# Patient Record
Sex: Male | Born: 1970
Health system: Southern US, Community
[De-identification: ages and names within clinical notes are randomized; demographics above are authoritative.]

## PROBLEM LIST (undated history)

## (undated) DIAGNOSIS — K219 Gastro-esophageal reflux disease without esophagitis: Secondary | ICD-10-CM

## (undated) DIAGNOSIS — F329 Major depressive disorder, single episode, unspecified: Secondary | ICD-10-CM

## (undated) DIAGNOSIS — K603 Anal fistula, unspecified: Secondary | ICD-10-CM

## (undated) DIAGNOSIS — F32A Depression, unspecified: Secondary | ICD-10-CM

## (undated) DIAGNOSIS — F419 Anxiety disorder, unspecified: Secondary | ICD-10-CM

## (undated) DIAGNOSIS — I10 Essential (primary) hypertension: Secondary | ICD-10-CM

## (undated) DIAGNOSIS — M1990 Unspecified osteoarthritis, unspecified site: Secondary | ICD-10-CM

## (undated) HISTORY — PX: COLONOSCOPY: SHX174

## (undated) HISTORY — PX: NASAL SINUS SURGERY: SHX719

## (undated) HISTORY — DX: Essential (primary) hypertension: I10

## (undated) HISTORY — DX: Anxiety disorder, unspecified: F41.9

## (undated) HISTORY — PX: UPPER GASTROINTESTINAL ENDOSCOPY: SHX188

## (undated) HISTORY — PX: HERNIA REPAIR: SHX51

## (undated) HISTORY — PX: INGUINAL HERNIA REPAIR: SUR1180

## (undated) HISTORY — DX: Depression, unspecified: F32.A

---
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
## 2005-08-10 ENCOUNTER — Emergency Department (HOSPITAL_COMMUNITY): Admission: EM | Admit: 2005-08-10 | Discharge: 2005-08-10 | Payer: Self-pay | Admitting: Emergency Medicine

## 2008-10-18 ENCOUNTER — Encounter: Admission: RE | Admit: 2008-10-18 | Discharge: 2008-10-18 | Payer: Self-pay | Admitting: Family Medicine

## 2013-03-19 HISTORY — PX: NASAL SINUS SURGERY: SHX719

## 2014-06-02 ENCOUNTER — Other Ambulatory Visit: Payer: Self-pay | Admitting: Neurosurgery

## 2014-07-14 ENCOUNTER — Encounter (HOSPITAL_COMMUNITY)
Admission: RE | Admit: 2014-07-14 | Discharge: 2014-07-14 | Disposition: A | Discharge status: Home / Self Care | Payer: BLUE CROSS/BLUE SHIELD | Source: Ambulatory Visit | Attending: Neurosurgery | Admitting: Neurosurgery

## 2014-07-14 ENCOUNTER — Encounter (HOSPITAL_COMMUNITY): Payer: Self-pay

## 2014-07-14 DIAGNOSIS — F17200 Nicotine dependence, unspecified, uncomplicated: Secondary | ICD-10-CM | POA: Insufficient documentation

## 2014-07-14 DIAGNOSIS — Z01812 Encounter for preprocedural laboratory examination: Secondary | ICD-10-CM | POA: Insufficient documentation

## 2014-07-14 DIAGNOSIS — K219 Gastro-esophageal reflux disease without esophagitis: Secondary | ICD-10-CM | POA: Diagnosis not present

## 2014-07-14 DIAGNOSIS — Z01818 Encounter for other preprocedural examination: Secondary | ICD-10-CM | POA: Insufficient documentation

## 2014-07-14 HISTORY — DX: Gastro-esophageal reflux disease without esophagitis: K21.9

## 2014-07-14 HISTORY — DX: Unspecified osteoarthritis, unspecified site: M19.90

## 2014-07-14 LAB — CBC WITH DIFFERENTIAL/PLATELET
Basophils Absolute: 0.1 10*3/uL (ref 0.0–0.1)
Basophils Relative: 1 % (ref 0–1)
EOS PCT: 2 % (ref 0–5)
Eosinophils Absolute: 0.2 10*3/uL (ref 0.0–0.7)
HEMATOCRIT: 33.7 % — AB (ref 39.0–52.0)
HEMOGLOBIN: 9.7 g/dL — AB (ref 13.0–17.0)
LYMPHS ABS: 2.3 10*3/uL (ref 0.7–4.0)
Lymphocytes Relative: 26 % (ref 12–46)
MCH: 20.4 pg — ABNORMAL LOW (ref 26.0–34.0)
MCHC: 28.8 g/dL — ABNORMAL LOW (ref 30.0–36.0)
MCV: 70.8 fL — ABNORMAL LOW (ref 78.0–100.0)
MONOS PCT: 11 % (ref 3–12)
Monocytes Absolute: 1 10*3/uL (ref 0.1–1.0)
NEUTROS PCT: 60 % (ref 43–77)
Neutro Abs: 5.4 10*3/uL (ref 1.7–7.7)
PLATELETS: 292 10*3/uL (ref 150–400)
RBC: 4.76 MIL/uL (ref 4.22–5.81)
RDW: 17.5 % — ABNORMAL HIGH (ref 11.5–15.5)
WBC: 9 10*3/uL (ref 4.0–10.5)

## 2014-07-14 LAB — PROTIME-INR
INR: 1.11 (ref 0.00–1.49)
Prothrombin Time: 14.5 seconds (ref 11.6–15.2)

## 2014-07-14 LAB — BASIC METABOLIC PANEL
ANION GAP: 8 (ref 5–15)
BUN: 17 mg/dL (ref 6–23)
CALCIUM: 9.8 mg/dL (ref 8.4–10.5)
CO2: 25 mmol/L (ref 19–32)
CREATININE: 0.83 mg/dL (ref 0.50–1.35)
Chloride: 106 mmol/L (ref 96–112)
Glucose, Bld: 104 mg/dL — ABNORMAL HIGH (ref 70–99)
Potassium: 3.6 mmol/L (ref 3.5–5.1)
Sodium: 139 mmol/L (ref 135–145)

## 2014-07-14 LAB — SURGICAL PCR SCREEN
MRSA, PCR: NEGATIVE
STAPHYLOCOCCUS AUREUS: POSITIVE — AB

## 2014-07-14 NOTE — Progress Notes (Addendum)
Denies any cardiac issues.   DA  Called Lorriane Shire regarding patients hgb of 9.7   DA

## 2014-07-14 NOTE — Pre-Procedure Instructions (Addendum)
Ranveer Wahlstrom  07/14/2014   Your procedure is scheduled on: Tuesday, May 10th   Report to [facility] Admitting at 6:00 AM.   Call this number if you have problems the morning of surgery: [phone]   Remember:   Do not eat food or drink liquids after midnight Monday.    Take these medicines the morning of surgery with A SIP OF WATER: Hydrocodone.  Please use Flonase the morning of surgery.                           Stop taking any aspirin, anti-inflammatories 4-5 days prior to surgery.   Do not wear jewelry - no rings or watches. Do not wear lotions or colognes.   . Men may shave face and neck.   Do not bring valuables to the hospital.  [facility] is not responsible for any belongings or valuables.               Contacts, dentures or bridgework may not be worn into surgery.  Leave suitcase in the car. After surgery it may be brought to your room.  For patients admitted to the hospital, discharge time is determined by your treatment team.                 Name and phone number of your driver:                Special Instructions: "Preparing for Surgery" instructin sheet.   Please read over the following fact sheets that you were given: Pain Booklet, Coughing and Deep Breathing, MRSA Information and Surgical Site Infection Prevention

## 2014-07-15 ENCOUNTER — Encounter (HOSPITAL_COMMUNITY): Payer: Self-pay | Admitting: Emergency Medicine

## 2014-07-15 NOTE — Progress Notes (Signed)
Anesthesia Chart Review:  Pt is 44 year old male scheduled for maximum access PLIF on 07/27/2014 with Dr. Annette Stable.   PCP is Carlos Levering, PA at [facility].   PMH includes: GERD. Current smoker. BMI 26.   Preoperative labs reviewed.  H/H 9.7/33.7. PAT RN notified Lorriane Shire in Dr. Marchelle Folks office of low results.   PCP's office does not have CBC results for pt for comparison.   Discussed with Dr. Lissa Hoard. Pt will need medical clearance prior to surgery. Notified Vanessa in Dr. Marchelle Folks office.   Willeen Cass, FNP-BC [facility]/Anesthesiology Phone: [phone] 07/15/2014 4:08 PM

## 2014-07-21 NOTE — Progress Notes (Signed)
Spoke with Lorriane Shire about Medical Clearance. She states that they have not received anything from pt.'s medical doctor as of today. She will follow-up with patient.

## 2014-07-27 ENCOUNTER — Encounter (HOSPITAL_COMMUNITY): Admission: RE | Payer: BLUE CROSS/BLUE SHIELD | Source: Ambulatory Visit

## 2014-07-27 ENCOUNTER — Inpatient Hospital Stay (HOSPITAL_COMMUNITY): Admission: RE | Admit: 2014-07-27 | Payer: BLUE CROSS/BLUE SHIELD | Source: Ambulatory Visit | Admitting: Neurosurgery

## 2014-07-27 SURGERY — FOR MAXIMUM ACCESS (MAS) POSTERIOR LUMBAR INTERBODY FUSION (PLIF) 1 LEVEL
Anesthesia: General

## 2014-08-05 ENCOUNTER — Telehealth: Payer: Self-pay | Admitting: Hematology & Oncology

## 2014-08-05 NOTE — Telephone Encounter (Signed)
Per Referral to sch New patient apt.  Apt was sch for 08/30/14.  i called patient and gave apt date/time, location, and instructions to bring medication bottles and insurance card.  Patient is aware of apt

## 2014-08-27 ENCOUNTER — Other Ambulatory Visit: Payer: Self-pay | Admitting: Emergency Medicine

## 2014-08-27 ENCOUNTER — Telehealth: Payer: Self-pay | Admitting: Hematology & Oncology

## 2014-08-27 NOTE — Telephone Encounter (Signed)
Left vm w NEW PATIENT today to remind them of their appointment with Dr. Ennever. Also, advised them to bring all medication bottles and insurance card information. ° °

## 2014-08-30 ENCOUNTER — Ambulatory Visit (HOSPITAL_BASED_OUTPATIENT_CLINIC_OR_DEPARTMENT_OTHER): Payer: BLUE CROSS/BLUE SHIELD | Admitting: Hematology & Oncology

## 2014-08-30 ENCOUNTER — Ambulatory Visit: Payer: BLUE CROSS/BLUE SHIELD

## 2014-08-30 ENCOUNTER — Other Ambulatory Visit (HOSPITAL_BASED_OUTPATIENT_CLINIC_OR_DEPARTMENT_OTHER): Payer: BLUE CROSS/BLUE SHIELD

## 2014-08-30 ENCOUNTER — Encounter: Payer: Self-pay | Admitting: Hematology & Oncology

## 2014-08-30 VITALS — BP 148/83 | HR 78 | Temp 97.7°F | Resp 16 | Wt 221.1 lb

## 2014-08-30 DIAGNOSIS — D589 Hereditary hemolytic anemia, unspecified: Secondary | ICD-10-CM

## 2014-08-30 DIAGNOSIS — D500 Iron deficiency anemia secondary to blood loss (chronic): Secondary | ICD-10-CM

## 2014-08-30 DIAGNOSIS — D509 Iron deficiency anemia, unspecified: Secondary | ICD-10-CM

## 2014-08-30 LAB — CBC WITH DIFFERENTIAL (CANCER CENTER ONLY)
BASO#: 0.1 10*3/uL (ref 0.0–0.2)
BASO%: 1.4 % (ref 0.0–2.0)
EOS ABS: 0.3 10*3/uL (ref 0.0–0.5)
EOS%: 3.8 % (ref 0.0–7.0)
HCT: 34.8 % — ABNORMAL LOW (ref 38.7–49.9)
HEMOGLOBIN: 10.5 g/dL — AB (ref 13.0–17.1)
LYMPH#: 1.9 10*3/uL (ref 0.9–3.3)
LYMPH%: 23.7 % (ref 14.0–48.0)
MCH: 22.7 pg — ABNORMAL LOW (ref 28.0–33.4)
MCHC: 30.2 g/dL — ABNORMAL LOW (ref 32.0–35.9)
MCV: 75 fL — ABNORMAL LOW (ref 82–98)
MONO#: 0.8 10*3/uL (ref 0.1–0.9)
MONO%: 10.6 % (ref 0.0–13.0)
NEUT%: 60.5 % (ref 40.0–80.0)
NEUTROS ABS: 4.8 10*3/uL (ref 1.5–6.5)
Platelets: 294 10*3/uL (ref 145–400)
RBC: 4.63 10*6/uL (ref 4.20–5.70)
RDW: 21 % — ABNORMAL HIGH (ref 11.1–15.7)
WBC: 7.9 10*3/uL (ref 4.0–10.0)

## 2014-08-30 LAB — TECHNOLOGIST REVIEW CHCC SATELLITE

## 2014-08-30 LAB — CHCC SATELLITE - SMEAR

## 2014-08-30 NOTE — Progress Notes (Signed)
Referral MD  Reason for Referral: Microcytic anemia   No chief complaint on file. : I was supposed to have back surgery but this was canceled because of my blood counts.  HPI: Mr. Carlos Daugherty is a 44 year old gentleman. He's been in good shape. He unfortunately has a disc herniation at L4-5. He was to undergo a laminectomy. However, he had some preop labs done which showed him to be quite anemic. The laboratory that I have on him showed that his was a cows 9. Hemoglobin 9.7. Hematocrit 33.7. His platelet count was 292,000. His MCV was quite low at 71.  Because of this, his surgery was canceled and he was referred to the [facility] for an evaluation.  He has had a colonoscopy in the past. He was told he has bleeding hemorrhoids. The hemorrhoids still bleeding but not as bad.  Of note, he had nasal sinus surgery earlier this year. There is no mention of his blood counts being a problem.  He has been taking some iron supplements.  He and his family went on a cruise back in May. He felt good. Had a little bit of ankle swelling. He did not note any bleeding.  He has smoked in the past but now has stopped. He does have some alcohol use. He has no obvious risk factors for hepatitis or HIV.  There is no cancer in the family.  He does not chew ice. He's had no pain in his hands or feet. He does have the sciatic-like pain down the left leg.  Overall, his performance status is ECOG 0.   Past Medical History  Diagnosis Date  . GERD (gastroesophageal reflux disease)   . Arthritis   :  Past Surgical History  Procedure Laterality Date  . Nasal sinus surgery    . Hernia repair    . Upper gastrointestinal endoscopy      looking d/t acid reflux  :   Current outpatient prescriptions:  .  esomeprazole (NEXIUM) 20 MG capsule, Take 22.5 mg by mouth daily at 12 noon., Disp: , Rfl:  .  fluticasone (FLONASE) 50 MCG/ACT nasal spray, Place 2 sprays into both nostrils at bedtime. ,  Disp: , Rfl: 11 .  HYDROcodone-acetaminophen (NORCO) 10-325 MG per tablet, Take 1 tablet by mouth every 6 (six) hours as needed (pain). , Disp: , Rfl: 0 .  ibuprofen (ADVIL,MOTRIN) 200 MG tablet, Take 600 mg by mouth 2 (two) times daily as needed (pain)., Disp: , Rfl:  .  Tetrahydrozoline HCl (VISINE OP), Place 1 drop into both eyes daily as needed (dry eyes)., Disp: , Rfl: :  :  No Known Allergies:  History reviewed. No pertinent family history.:  History   Social History  . Marital Status: Married    Spouse Name: N/A  . Number of Children: N/A  . Years of Education: N/A   Occupational History  . Not on file.   Social History Main Topics  . Smoking status: Current Every Day Smoker -- 1 years    Types: E-cigarettes  . Smokeless tobacco: Not on file  . Alcohol Use: 3.6 oz/week    6 Cans of beer per week  . Drug Use: No  . Sexual Activity: Not on file   Other Topics Concern  . Not on file   Social History Narrative  :  Pertinent items are noted in HPI.  Exam: _0 @  well-developed and well-nourished white gentleman in no obvious distress. Vital signs are temperature of 97.7. Pulse  78. Blood pressure 148/83. Weight is 221 pounds. Head and neck exam shows no ocular or oral lesions. There is no scleral icterus. He has no adenopathy in the neck. There is no glossitis. Thyroid is nonpalpable. Lungs are clear to percussion and auscultation bilaterally cardiac exam regular rate and rhythm with no murmurs, rubs or bruits. Abdomen is soft. He has good bowel sounds. There is no fluid wave. There is no palpable liver or spleen tip. Back exam shows no tenderness over the spine, ribs or hips. Extremities shows no clubbing, cyanosis or edema. He has good range of motion of his joints. This no joint swelling, erythema or warmth. Skin exam shows no rashes, ecchymoses or petechia. Neurological exam shows no focal neurological deficits.    Recent Labs  08/30/14 1503  WBC 7.9  HGB  10.5*  HCT 34.8*  PLT 294   No results for input(s): NA, K, CL, CO2, GLUCOSE, BUN, CREATININE, CALCIUM in the last 72 hours.  Blood smear review: Microcytic population of red blood cells. There is some slight anisocytosis. I see no target cells. I really do not see spherocytes. He has no nucleated red blood cells. I see no teardrop cells. He has no rouleau formation. White cells per normal morphology maturation. There is no immature myeloid or lymphoid forms. He has no hypersegmented polys. There are no blasts. Platelets are adequate in number and size. He has well granulated platelets.  Pathology: None     Assessment and Plan: Mr. Carlos Daugherty is a 44 year old white male. He has microcytic anemia. I have to believe that this is iron deficiency. The blood smear is very consistent with iron deficiency.  I do not see anything that looks like hemolytic anemia. I do not see any spherocytes that look like hereditary spherocytosis. He has no changes on his blood smear that would suggest a marrow issue.  I do not see that we have to do a bone marrow biopsy on him.  I gave him a prescription for therapeutic iron. I think this will get his blood count up.  I really do not see any reason that he cannot have his back surgery. I cannot imagine him having complications from the surgery because of his blood counts. I will let his neurosurgeon know that I saw him.  As far as getting him back to our office, hopefully, we can take care of a lot of this over the phone.  I spent a good 45 minutes with he and his wife. The both their very nice. I just feel bad for him that he has all the anal issues and that surgery will be able to take care of this.

## 2014-08-31 LAB — IRON AND TIBC CHCC
Iron: <11 ug/dL — ABNORMAL LOW (ref 42–163)
TIBC: 523 ug/dL — ABNORMAL HIGH (ref 202–409)

## 2014-08-31 LAB — FERRITIN CHCC: FERRITIN: 11 ng/mL — AB (ref 22–316)

## 2014-09-01 ENCOUNTER — Telehealth: Payer: Self-pay | Admitting: *Deleted

## 2014-09-01 LAB — RETICULOCYTES (CHCC)
ABS Retic: 61.1 10*3/uL (ref 19.0–186.0)
RBC.: 4.7 MIL/uL (ref 4.22–5.81)
RETIC CT PCT: 1.3 % (ref 0.4–2.3)

## 2014-09-01 LAB — HEMOGLOBINOPATHY EVALUATION
HEMOGLOBIN OTHER: 0 %
HGB A2 QUANT: 2 % — AB (ref 2.2–3.2)
HGB A: 98 % — AB (ref 96.8–97.8)
HGB F QUANT: 0 % (ref 0.0–2.0)
HGB S QUANTITAION: 0 %

## 2014-09-01 LAB — COMPREHENSIVE METABOLIC PANEL
ALBUMIN: 4.8 g/dL (ref 3.5–5.2)
ALK PHOS: 79 U/L (ref 39–117)
ALT: 42 U/L (ref 0–53)
AST: 23 U/L (ref 0–37)
BUN: 13 mg/dL (ref 6–23)
CALCIUM: 10 mg/dL (ref 8.4–10.5)
CHLORIDE: 104 meq/L (ref 96–112)
CO2: 25 mEq/L (ref 19–32)
Creatinine, Ser: 0.89 mg/dL (ref 0.50–1.35)
Glucose, Bld: 112 mg/dL — ABNORMAL HIGH (ref 70–99)
POTASSIUM: 5.1 meq/L (ref 3.5–5.3)
SODIUM: 139 meq/L (ref 135–145)
Total Bilirubin: 0.3 mg/dL (ref 0.2–1.2)
Total Protein: 7.9 g/dL (ref 6.0–8.3)

## 2014-09-01 LAB — HAPTOGLOBIN: Haptoglobin: 148 mg/dL (ref 43–212)

## 2014-09-01 LAB — LACTATE DEHYDROGENASE: LDH: 147 U/L (ref 94–250)

## 2014-09-01 NOTE — Telephone Encounter (Signed)
-----   Message from [name], MD sent at 09/01/2014  1:19 PM EDT ----- I left a message on his machine about his iron studies being low. I told him that the anemia is probably from iron deficiency. I would think that this is from his bleeding hemorrhoids. I don't think we have to do any scans or another colonoscopy on him at this point.  I told him that I spoke to his neurosurgeon, Dr. Trenton Gammon, and his office will set him up for surgery, hopefully quickly.  I want to see him back in 3 months. Our office will call him regarding this. I told him that if he has any questions, that he can was call us.  Carlos Daugherty

## 2014-09-03 ENCOUNTER — Other Ambulatory Visit: Payer: Self-pay | Admitting: Neurosurgery

## 2014-09-03 LAB — RBC OSMOTIC FRAGILITY
NACL  0.30%: 75 % — ABNORMAL LOW (ref 80–100)
NACL  0.45%: 25 % — ABNORMAL LOW (ref 54–96)
NACL  0.65%: 2 % (ref 0–19)
NACL 0.35%: 58 % — AB (ref 72–100)
NACL 0.40%: 41 % — AB (ref 65–100)
NACL 0.50%: 16 % — AB (ref 36–88)
NACL 0.55%: 9 % (ref 5–70)
NACL 0.60%: 5 % (ref 0–40)
NACL 0.85%: 0 % (ref 0–0)

## 2014-09-22 NOTE — Pre-Procedure Instructions (Signed)
Carlos Daugherty  09/22/2014     Your procedure is scheduled on: Monday September 27, 2014 at 2:00 PM.  Report to [facility] Admitting at 12:00 P.M.  Call this number if you have problems the morning of surgery: [phone]    Remember:  Do not eat food or drink liquids after midnight.  Take these medicines the morning of surgery with A SIP OF WATER: Esomeprazole (Nexium), Hydrocodone if needed, Eye drops if needed   Please stop taking any vitamins, herbal medications, Ibuprofen, Advil, Motrin, Aleve, etc   Do not wear jewelry.  Do not wear lotions, powders, or cologne.     Men may shave face and neck.  Do not bring valuables to the hospital.  [facility] is not responsible for any belongings or valuables.  Contacts, dentures or bridgework may not be worn into surgery.  Leave your suitcase in the car.  After surgery it may be brought to your room.  For patients admitted to the hospital, discharge time will be determined by your treatment team.  Patients discharged the day of surgery will not be allowed to drive home.   Name and phone number of your driver:    Special instructions:  Shower using CHG soap the night before and the morning of your surgery  Please read over the following fact sheets that you were given. Pain Booklet, Coughing and Deep Breathing, Blood Transfusion Information, MRSA Information and Surgical Site Infection Prevention

## 2014-09-23 ENCOUNTER — Encounter (HOSPITAL_COMMUNITY): Payer: Self-pay

## 2014-09-23 ENCOUNTER — Encounter (HOSPITAL_COMMUNITY)
Admission: RE | Admit: 2014-09-23 | Discharge: 2014-09-23 | Disposition: A | Discharge status: Home / Self Care | Payer: BLUE CROSS/BLUE SHIELD | Source: Ambulatory Visit | Attending: Neurosurgery | Admitting: Neurosurgery

## 2014-09-23 DIAGNOSIS — Z0183 Encounter for blood typing: Secondary | ICD-10-CM | POA: Insufficient documentation

## 2014-09-23 DIAGNOSIS — Z01812 Encounter for preprocedural laboratory examination: Secondary | ICD-10-CM | POA: Diagnosis not present

## 2014-09-23 DIAGNOSIS — Z01818 Encounter for other preprocedural examination: Secondary | ICD-10-CM | POA: Insufficient documentation

## 2014-09-23 DIAGNOSIS — M5126 Other intervertebral disc displacement, lumbar region: Secondary | ICD-10-CM | POA: Insufficient documentation

## 2014-09-23 DIAGNOSIS — M5136 Other intervertebral disc degeneration, lumbar region: Secondary | ICD-10-CM | POA: Insufficient documentation

## 2014-09-23 LAB — SURGICAL PCR SCREEN
MRSA, PCR: NEGATIVE
Staphylococcus aureus: NEGATIVE

## 2014-09-23 LAB — TYPE AND SCREEN
ABO/RH(D): A POS
ANTIBODY SCREEN: NEGATIVE

## 2014-09-23 LAB — CBC WITH DIFFERENTIAL/PLATELET
BASOS ABS: 0.1 10*3/uL (ref 0.0–0.1)
BASOS PCT: 1 % (ref 0–1)
EOS PCT: 2 % (ref 0–5)
Eosinophils Absolute: 0.2 10*3/uL (ref 0.0–0.7)
HCT: 41.6 % (ref 39.0–52.0)
HEMOGLOBIN: 13 g/dL (ref 13.0–17.0)
LYMPHS PCT: 17 % (ref 12–46)
Lymphs Abs: 1.9 10*3/uL (ref 0.7–4.0)
MCH: 25.6 pg — AB (ref 26.0–34.0)
MCHC: 31.3 g/dL (ref 30.0–36.0)
MCV: 81.9 fL (ref 78.0–100.0)
MONO ABS: 0.7 10*3/uL (ref 0.1–1.0)
MONOS PCT: 6 % (ref 3–12)
NEUTROS PCT: 74 % (ref 43–77)
Neutro Abs: 8.1 10*3/uL — ABNORMAL HIGH (ref 1.7–7.7)
Platelets: 298 10*3/uL (ref 150–400)
RBC: 5.08 MIL/uL (ref 4.22–5.81)
RDW: 26.1 % — ABNORMAL HIGH (ref 11.5–15.5)
WBC: 11 10*3/uL — AB (ref 4.0–10.5)

## 2014-09-23 LAB — ABO/RH: ABO/RH(D): A POS

## 2014-09-23 NOTE — Progress Notes (Signed)
Nurse called patient and informed him that it was OK for him to continue taking his fusion plus supplement and if anything changed he would be notified. Patient verbalized understanding.

## 2014-09-23 NOTE — Progress Notes (Signed)
PCP is Carlos Levering, PA-C. Patient informed Nurse that his blood count had been decreased, his surgery was canceled, and he had to see Dr. Burney Gauze about it.  Patient asked Nurse if he needed to stop taking the fusion plus probiotic he had been prescribed, and Nurse told patient that he did not need to stop taking the medication, however Ebony Hail, [place] would be notified of this, and Nurse would call patient later today and definitely let him know if he needed to stop taking fusion plus before surgery. Ebony Hail, [place] notified and she stated that patient did not need to stop taking medication, but he did not need to take it the morning of surgery, and if anything changed then patient would be notified.

## 2014-09-24 NOTE — Progress Notes (Signed)
Anesthesia follow-up: See anesthesia note by Kabbe, FNP-BC from 07/15/14. PLIF was postponed due to need for clearance for anemia with H/H 9.7/33.7 with MVC of 71.  Patient was referred to hematologist Dr. Marin Olp.  According to his note from 08/30/14, patient's anemia was felt due to iron deficiency--"The blood smear is very consistent with iron deficiency. I do not see anything that looks like hemolytic anemia. I do not see any spherocytes that look like hereditary spherocytosis. He has no changes on his blood smear that would suggest a marrow issue. I do not see that we have to do a bone marrow biopsy on him. I gave him a prescription for therapeutic iron. I think this will get his blood count up. I really do not see any reason that he cannot have his back surgery. I cannot imagine him having complications from the surgery because of his blood counts. I will let his neurosurgeon know that I saw him."  Patient has been on Fusion Plus. H/H is now up to 13.0/41.6, MCV normal at 81.9.   I anticipate that he can proceed as planned.  [facility] Stay [facility]/Anesthesiology Phone [phone] 09/24/2014 2:35 PM

## 2014-09-27 ENCOUNTER — Inpatient Hospital Stay (HOSPITAL_COMMUNITY): Payer: BLUE CROSS/BLUE SHIELD

## 2014-09-27 ENCOUNTER — Encounter (HOSPITAL_COMMUNITY)
Admission: RE | Disposition: A | Discharge status: Home / Self Care | Payer: Self-pay | Source: Ambulatory Visit | Attending: Neurosurgery

## 2014-09-27 ENCOUNTER — Inpatient Hospital Stay (HOSPITAL_COMMUNITY): Payer: BLUE CROSS/BLUE SHIELD | Admitting: Vascular Surgery

## 2014-09-27 ENCOUNTER — Inpatient Hospital Stay (HOSPITAL_COMMUNITY): Payer: BLUE CROSS/BLUE SHIELD | Admitting: Anesthesiology

## 2014-09-27 ENCOUNTER — Inpatient Hospital Stay (HOSPITAL_COMMUNITY)
Admission: RE | Admit: 2014-09-27 | Discharge: 2014-09-28 | DRG: 460 | Disposition: A | Discharge status: Home / Self Care | Payer: BLUE CROSS/BLUE SHIELD | Source: Ambulatory Visit | Attending: Neurosurgery | Admitting: Neurosurgery

## 2014-09-27 DIAGNOSIS — F17290 Nicotine dependence, other tobacco product, uncomplicated: Secondary | ICD-10-CM | POA: Diagnosis present

## 2014-09-27 DIAGNOSIS — M5136 Other intervertebral disc degeneration, lumbar region: Principal | ICD-10-CM | POA: Diagnosis present

## 2014-09-27 DIAGNOSIS — M1990 Unspecified osteoarthritis, unspecified site: Secondary | ICD-10-CM | POA: Diagnosis present

## 2014-09-27 DIAGNOSIS — K219 Gastro-esophageal reflux disease without esophagitis: Secondary | ICD-10-CM | POA: Diagnosis present

## 2014-09-27 DIAGNOSIS — Z419 Encounter for procedure for purposes other than remedying health state, unspecified: Secondary | ICD-10-CM

## 2014-09-27 HISTORY — PX: MAXIMUM ACCESS (MAS)POSTERIOR LUMBAR INTERBODY FUSION (PLIF) 1 LEVEL: SHX6368

## 2014-09-27 SURGERY — FOR MAXIMUM ACCESS (MAS) POSTERIOR LUMBAR INTERBODY FUSION (PLIF) 1 LEVEL
Anesthesia: General | Site: Back

## 2014-09-27 MED ORDER — OXYCODONE-ACETAMINOPHEN 5-325 MG PO TABS
1.0000 | ORAL_TABLET | ORAL | Status: DC | PRN
  Administered 2014-09-27 – 2014-09-28 (×4): 2 via ORAL
  Filled 2014-09-27 (×4): qty 2

## 2014-09-27 MED ORDER — FENTANYL CITRATE (PF) 250 MCG/5ML IJ SOLN
INTRAMUSCULAR | Status: AC
  Filled 2014-09-27: qty 5

## 2014-09-27 MED ORDER — ONDANSETRON HCL 4 MG/2ML IJ SOLN
INTRAMUSCULAR | Status: AC
  Filled 2014-09-27: qty 2

## 2014-09-27 MED ORDER — ONDANSETRON HCL 4 MG/2ML IJ SOLN
INTRAMUSCULAR | Status: DC | PRN
  Administered 2014-09-27: 4 mg via INTRAVENOUS

## 2014-09-27 MED ORDER — PHENOL 1.4 % MT LIQD: 1.0000 | OROMUCOSAL | Status: DC | PRN

## 2014-09-27 MED ORDER — KETAMINE HCL 100 MG/ML IJ SOLN
INTRAMUSCULAR | Status: AC
  Filled 2014-09-27: qty 1

## 2014-09-27 MED ORDER — MIDAZOLAM HCL 2 MG/2ML IJ SOLN
INTRAMUSCULAR | Status: AC
  Filled 2014-09-27: qty 2

## 2014-09-27 MED ORDER — CEFAZOLIN SODIUM 1-5 GM-% IV SOLN
1.0000 g | Freq: Three times a day (TID) | INTRAVENOUS | Status: AC
  Administered 2014-09-27 – 2014-09-28 (×2): 1 g via INTRAVENOUS
  Filled 2014-09-27 (×2): qty 50

## 2014-09-27 MED ORDER — HYDROMORPHONE HCL 1 MG/ML IJ SOLN
0.5000 mg | INTRAMUSCULAR | Status: DC | PRN
  Administered 2014-09-27: 1 mg via INTRAVENOUS
  Filled 2014-09-27: qty 1

## 2014-09-27 MED ORDER — DIAZEPAM 5 MG PO TABS
ORAL_TABLET | ORAL | Status: AC
  Filled 2014-09-27: qty 1

## 2014-09-27 MED ORDER — LIDOCAINE HCL (CARDIAC) 20 MG/ML IV SOLN
INTRAVENOUS | Status: DC | PRN
  Administered 2014-09-27: 60 mg via INTRAVENOUS

## 2014-09-27 MED ORDER — SUCCINYLCHOLINE CHLORIDE 20 MG/ML IJ SOLN
INTRAMUSCULAR | Status: DC | PRN
  Administered 2014-09-27: 120 mg via INTRAVENOUS

## 2014-09-27 MED ORDER — PROPOFOL 10 MG/ML IV BOLUS
INTRAVENOUS | Status: AC
  Filled 2014-09-27: qty 20

## 2014-09-27 MED ORDER — THROMBIN 20000 UNITS EX SOLR
CUTANEOUS | Status: DC | PRN
  Administered 2014-09-27: 20 mL via TOPICAL

## 2014-09-27 MED ORDER — PROPOFOL 10 MG/ML IV BOLUS
INTRAVENOUS | Status: DC | PRN
  Administered 2014-09-27: 200 mg via INTRAVENOUS

## 2014-09-27 MED ORDER — LIDOCAINE HCL (CARDIAC) 20 MG/ML IV SOLN
INTRAVENOUS | Status: AC
  Filled 2014-09-27: qty 5

## 2014-09-27 MED ORDER — VANCOMYCIN HCL 1000 MG IV SOLR
INTRAVENOUS | Status: AC
Start: 2014-09-27 — End: 2014-09-28
  Filled 2014-09-27: qty 1000

## 2014-09-27 MED ORDER — HYDROMORPHONE HCL 1 MG/ML IJ SOLN
INTRAMUSCULAR | Status: AC
  Filled 2014-09-27: qty 1

## 2014-09-27 MED ORDER — OXYCODONE HCL 5 MG PO TABS
ORAL_TABLET | ORAL | Status: AC
  Filled 2014-09-27: qty 1

## 2014-09-27 MED ORDER — DEXAMETHASONE SODIUM PHOSPHATE 10 MG/ML IJ SOLN
INTRAMUSCULAR | Status: AC
  Administered 2014-09-27: 10 mg via INTRAVENOUS
  Filled 2014-09-27: qty 1

## 2014-09-27 MED ORDER — DEXAMETHASONE SODIUM PHOSPHATE 10 MG/ML IJ SOLN: 10.0000 mg | Freq: Once | INTRAMUSCULAR | Status: DC

## 2014-09-27 MED ORDER — SODIUM CHLORIDE 0.9 % IJ SOLN: 3.0000 mL | INTRAMUSCULAR | Status: DC | PRN

## 2014-09-27 MED ORDER — ONDANSETRON HCL 4 MG/2ML IJ SOLN: 4.0000 mg | INTRAMUSCULAR | Status: DC | PRN

## 2014-09-27 MED ORDER — ROCURONIUM BROMIDE 50 MG/5ML IV SOLN
INTRAVENOUS | Status: AC
  Filled 2014-09-27: qty 1

## 2014-09-27 MED ORDER — FENTANYL CITRATE (PF) 100 MCG/2ML IJ SOLN
INTRAMUSCULAR | Status: DC | PRN
  Administered 2014-09-27: 100 ug via INTRAVENOUS
  Administered 2014-09-27: 150 ug via INTRAVENOUS
  Administered 2014-09-27: 50 ug via INTRAVENOUS
  Administered 2014-09-27: 150 ug via INTRAVENOUS
  Administered 2014-09-27: 50 ug via INTRAVENOUS
  Administered 2014-09-27: 100 ug via INTRAVENOUS
  Administered 2014-09-27 (×5): 50 ug via INTRAVENOUS
  Administered 2014-09-27: 100 ug via INTRAVENOUS

## 2014-09-27 MED ORDER — LACTATED RINGERS IV SOLN
INTRAVENOUS | Status: DC | PRN
  Administered 2014-09-27 (×2): via INTRAVENOUS

## 2014-09-27 MED ORDER — SODIUM CHLORIDE 0.9 % IV SOLN: 250.0000 mL | INTRAVENOUS | Status: DC

## 2014-09-27 MED ORDER — OXYCODONE HCL 5 MG PO TABS
5.0000 mg | ORAL_TABLET | Freq: Once | ORAL | Status: AC | PRN
  Administered 2014-09-27: 5 mg via ORAL

## 2014-09-27 MED ORDER — OXYCODONE HCL 5 MG/5ML PO SOLN: 5.0000 mg | Freq: Once | ORAL | Status: AC | PRN

## 2014-09-27 MED ORDER — KETOROLAC TROMETHAMINE 30 MG/ML IJ SOLN
30.0000 mg | Freq: Once | INTRAMUSCULAR | Status: AC | PRN
  Administered 2014-09-27: 30 mg via INTRAVENOUS

## 2014-09-27 MED ORDER — PANTOPRAZOLE SODIUM 40 MG PO TBEC
40.0000 mg | DELAYED_RELEASE_TABLET | Freq: Every day | ORAL | Status: DC
  Administered 2014-09-27: 40 mg via ORAL
  Filled 2014-09-27: qty 1

## 2014-09-27 MED ORDER — PROMETHAZINE HCL 25 MG/ML IJ SOLN: 6.2500 mg | INTRAMUSCULAR | Status: DC | PRN

## 2014-09-27 MED ORDER — ACETAMINOPHEN 325 MG PO TABS: 650.0000 mg | ORAL_TABLET | ORAL | Status: DC | PRN

## 2014-09-27 MED ORDER — CEFAZOLIN SODIUM-DEXTROSE 2-3 GM-% IV SOLR
INTRAVENOUS | Status: AC
  Filled 2014-09-27: qty 50

## 2014-09-27 MED ORDER — FLUTICASONE PROPIONATE 50 MCG/ACT NA SUSP
2.0000 | Freq: Every evening | NASAL | Status: DC | PRN
  Filled 2014-09-27: qty 16

## 2014-09-27 MED ORDER — MIDAZOLAM HCL 5 MG/5ML IJ SOLN
INTRAMUSCULAR | Status: DC | PRN
  Administered 2014-09-27: 2 mg via INTRAVENOUS

## 2014-09-27 MED ORDER — SODIUM CHLORIDE 0.9 % IJ SOLN: 3.0000 mL | Freq: Two times a day (BID) | INTRAMUSCULAR | Status: DC

## 2014-09-27 MED ORDER — MENTHOL 3 MG MT LOZG: 1.0000 | LOZENGE | OROMUCOSAL | Status: DC | PRN

## 2014-09-27 MED ORDER — CEFAZOLIN SODIUM-DEXTROSE 2-3 GM-% IV SOLR
2.0000 g | INTRAVENOUS | Status: AC
  Administered 2014-09-27: 2 g via INTRAVENOUS

## 2014-09-27 MED ORDER — LACTATED RINGERS IV SOLN
INTRAVENOUS | Status: DC
  Administered 2014-09-27: 13:00:00 via INTRAVENOUS

## 2014-09-27 MED ORDER — SODIUM CHLORIDE 0.9 % IR SOLN
Status: DC | PRN
  Administered 2014-09-27: 500 mL

## 2014-09-27 MED ORDER — HYDROMORPHONE HCL 1 MG/ML IJ SOLN
0.2500 mg | INTRAMUSCULAR | Status: AC | PRN
  Administered 2014-09-27 (×8): 0.5 mg via INTRAVENOUS

## 2014-09-27 MED ORDER — BUPIVACAINE HCL (PF) 0.25 % IJ SOLN
INTRAMUSCULAR | Status: DC | PRN
  Administered 2014-09-27: 20 mL

## 2014-09-27 MED ORDER — HYDROCODONE-ACETAMINOPHEN 5-325 MG PO TABS: 1.0000 | ORAL_TABLET | ORAL | Status: DC | PRN

## 2014-09-27 MED ORDER — 0.9 % SODIUM CHLORIDE (POUR BTL) OPTIME
TOPICAL | Status: DC | PRN
  Administered 2014-09-27: 1000 mL

## 2014-09-27 MED ORDER — KETOROLAC TROMETHAMINE 30 MG/ML IJ SOLN
INTRAMUSCULAR | Status: AC
  Filled 2014-09-27: qty 1

## 2014-09-27 MED ORDER — VANCOMYCIN HCL 1000 MG IV SOLR
INTRAVENOUS | Status: DC | PRN
  Administered 2014-09-27: 1000 mg

## 2014-09-27 MED ORDER — DEXAMETHASONE SODIUM PHOSPHATE 10 MG/ML IJ SOLN: 10.0000 mg | INTRAMUSCULAR | Status: DC

## 2014-09-27 MED ORDER — KETAMINE HCL 100 MG/ML IJ SOLN
INTRAMUSCULAR | Status: DC | PRN
  Administered 2014-09-27: 50 mg via INTRAVENOUS

## 2014-09-27 MED ORDER — DIAZEPAM 5 MG PO TABS
5.0000 mg | ORAL_TABLET | Freq: Four times a day (QID) | ORAL | Status: DC | PRN
Start: 2014-09-27 — End: 2014-09-28
  Administered 2014-09-27 (×2): 5 mg via ORAL
  Administered 2014-09-28 (×2): 10 mg via ORAL
  Filled 2014-09-27: qty 1
  Filled 2014-09-27 (×3): qty 2

## 2014-09-27 MED ORDER — ACETAMINOPHEN 650 MG RE SUPP
650.0000 mg | RECTAL | Status: DC | PRN
Start: 2014-09-27 — End: 2014-09-28

## 2014-09-27 MED ORDER — CALCIUM CARBONATE ANTACID 500 MG PO CHEW
1000.0000 mg | CHEWABLE_TABLET | Freq: Every day | ORAL | Status: DC
  Administered 2014-09-27: 1000 mg via ORAL
  Filled 2014-09-27 (×2): qty 2

## 2014-09-27 MED ORDER — FUSION PLUS PO CAPS: 1.0000 | ORAL_CAPSULE | Freq: Two times a day (BID) | ORAL | Status: DC

## 2014-09-27 SURGICAL SUPPLY — 68 items
BAG DECANTER FOR FLEXI CONT (MISCELLANEOUS) ×3 IMPLANT
BENZOIN TINCTURE PRP APPL 2/3 (GAUZE/BANDAGES/DRESSINGS) ×3 IMPLANT
BIT DRILL PLIF MAS 5.0MM DISP (DRILL) ×1 IMPLANT
BLADE CLIPPER SURG (BLADE) IMPLANT
BRUSH SCRUB EZ PLAIN DRY (MISCELLANEOUS) ×3 IMPLANT
BUR CUTTER 7.0 ROUND (BURR) ×3 IMPLANT
BUR MATCHSTICK NEURO 3.0 LAGG (BURR) ×3 IMPLANT
CAGE COROENT 11X9X23-8 (Cage) ×6 IMPLANT
CLIP NEUROVISION LG (CLIP) ×3 IMPLANT
CLOSURE WOUND 1/2 X4 (GAUZE/BANDAGES/DRESSINGS) ×1
CONT SPEC 4OZ CLIKSEAL STRL BL (MISCELLANEOUS) ×6 IMPLANT
COVER BACK TABLE 24X17X13 BIG (DRAPES) IMPLANT
COVER BACK TABLE 60X90IN (DRAPES) ×3 IMPLANT
DRAPE C-ARM 42X72 X-RAY (DRAPES) ×3 IMPLANT
DRAPE C-ARMOR (DRAPES) ×3 IMPLANT
DRAPE LAPAROTOMY 100X72X124 (DRAPES) ×3 IMPLANT
DRAPE POUCH INSTRU U-SHP 10X18 (DRAPES) ×3 IMPLANT
DRAPE SURG 17X23 STRL (DRAPES) ×12 IMPLANT
DRILL PLIF MAS 5.0MM DISP (DRILL) ×3
DRSG OPSITE POSTOP 4X6 (GAUZE/BANDAGES/DRESSINGS) ×3 IMPLANT
DURAPREP 26ML APPLICATOR (WOUND CARE) ×3 IMPLANT
ELECT BLADE 4.0 EZ CLEAN MEGAD (MISCELLANEOUS)
ELECT REM PT RETURN 9FT ADLT (ELECTROSURGICAL) ×3
ELECTRODE BLDE 4.0 EZ CLN MEGD (MISCELLANEOUS) IMPLANT
ELECTRODE REM PT RTRN 9FT ADLT (ELECTROSURGICAL) ×1 IMPLANT
EVACUATOR 1/8 PVC DRAIN (DRAIN) IMPLANT
GAUZE SPONGE 4X4 12PLY STRL (GAUZE/BANDAGES/DRESSINGS) ×3 IMPLANT
GAUZE SPONGE 4X4 16PLY XRAY LF (GAUZE/BANDAGES/DRESSINGS) IMPLANT
GLOVE ECLIPSE 9.0 STRL (GLOVE) ×6 IMPLANT
GLOVE EXAM NITRILE LRG STRL (GLOVE) IMPLANT
GLOVE EXAM NITRILE MD LF STRL (GLOVE) IMPLANT
GLOVE EXAM NITRILE XL STR (GLOVE) IMPLANT
GLOVE EXAM NITRILE XS STR PU (GLOVE) IMPLANT
GOWN STRL REUS W/ TWL LRG LVL3 (GOWN DISPOSABLE) IMPLANT
GOWN STRL REUS W/ TWL XL LVL3 (GOWN DISPOSABLE) ×2 IMPLANT
GOWN STRL REUS W/TWL 2XL LVL3 (GOWN DISPOSABLE) IMPLANT
GOWN STRL REUS W/TWL LRG LVL3 (GOWN DISPOSABLE)
GOWN STRL REUS W/TWL XL LVL3 (GOWN DISPOSABLE) ×4
GRAFT BN 5X1XSPNE CVD POST DBM (Bone Implant) ×1 IMPLANT
GRAFT BONE MAGNIFUSE 1X5CM (Bone Implant) ×2 IMPLANT
KIT BASIN OR (CUSTOM PROCEDURE TRAY) ×3 IMPLANT
KIT NEEDLE NVM5 EMG ELECT (KITS) ×1 IMPLANT
KIT NEEDLE NVM5 EMG ELECTRODE (KITS) ×2
KIT ROOM TURNOVER OR (KITS) ×3 IMPLANT
LIQUID BAND (GAUZE/BANDAGES/DRESSINGS) ×3 IMPLANT
NEEDLE HYPO 22GX1.5 SAFETY (NEEDLE) ×3 IMPLANT
NS IRRIG 1000ML POUR BTL (IV SOLUTION) ×3 IMPLANT
PACK LAMINECTOMY NEURO (CUSTOM PROCEDURE TRAY) ×3 IMPLANT
ROD PREBENT 45MM LUMBAR (Rod) ×6 IMPLANT
SCREW LOCK (Screw) ×8 IMPLANT
SCREW LOCK FXNS SPNE MAS PL (Screw) ×4 IMPLANT
SCREW MAS PLIF 5.5X30 (Screw) ×4 IMPLANT
SCREW PAS PLIF 5X30 (Screw) ×2 IMPLANT
SCREW SHANK 5.0X40MM (Screw) ×6 IMPLANT
SCREW TULIP 5.5 (Screw) ×6 IMPLANT
SPONGE LAP 4X18 X RAY DECT (DISPOSABLE) IMPLANT
SPONGE SURGIFOAM ABS GEL SZ50 (HEMOSTASIS) IMPLANT
STRIP CLOSURE SKIN 1/2X4 (GAUZE/BANDAGES/DRESSINGS) ×2 IMPLANT
SUT VIC AB 0 CT1 18XCR BRD8 (SUTURE) ×2 IMPLANT
SUT VIC AB 0 CT1 8-18 (SUTURE) ×4
SUT VIC AB 2-0 CT1 18 (SUTURE) ×3 IMPLANT
SUT VIC AB 3-0 SH 8-18 (SUTURE) ×3 IMPLANT
SYR 20ML ECCENTRIC (SYRINGE) ×3 IMPLANT
TOWEL OR 17X24 6PK STRL BLUE (TOWEL DISPOSABLE) ×3 IMPLANT
TOWEL OR 17X26 10 PK STRL BLUE (TOWEL DISPOSABLE) ×3 IMPLANT
TRAP SPECIMEN MUCOUS 40CC (MISCELLANEOUS) ×3 IMPLANT
TRAY FOLEY W/METER SILVER 14FR (SET/KITS/TRAYS/PACK) IMPLANT
WATER STERILE IRR 1000ML POUR (IV SOLUTION) ×3 IMPLANT

## 2014-09-27 NOTE — Op Note (Signed)
Date of procedure: 09/27/2014  Date of dictation: Same  Service: Neurosurgery  Preoperative diagnosis: L4-5 degenerative disc disease/herniated nucleus pulposus  Postoperative diagnosis: Same  Procedure Name: Bilateral L4-5 decompressive laminotomies and foraminotomies, more than would than would be needed for simple decompression alone.    L4-5 posterior lumbar interbody fusion utilizing interbody peek cages and locally harvested autograft  L4-5 posterior lateral arthrodesis utilizing nonsegmental cortical pedicle screw fixation    Surgeon:Hussien Greenblatt A.Chadwin Fury, M.D.  Asst. Surgeon: Arnoldo Morale  Anesthesia: General  Indications: 44 year old male with intractable back pain. Workup demonstrates evidence of progressive disc degeneration and central disc herniation at L4-5. Patient is failed conservative management. Presents now for L4-5 decompression and fusion.   Operative note: After induction of anesthesia, patient position prone onto [place] frame and appropriately padded. Lumbar region prepped and draped. Incision made overlying L4-5. Dissection performed bilaterally. Retractor placed. Fluoroscopy used. Level confirmed. Entry sites for cortical pedicle screws were marked in the approximate mid position of the pars and articularis of L4 bilaterally. Utilizing fluoroscopic guidance in both the AP and lateral planes cortical pedicle pilot holes were drilled under continuous neural monitoring. The pilot hole was probed and found to be solidly within the bone. Trajectory of the pilot hole was cephalad and lateral. Each pedicle was then tapped with a screw tap again using neural monitoring. 5.0 x 40 mm nuvasive cortical pedicle screws were placed bilaterally at L4. Bilateral decompressive laminotomies were then performed using high-speed drill and Kerrison rongeurs to remove the inferior aspect lamina of L4 the entire inferior facet of L4 bilaterally. The majority the superior facet of L5 bilaterally and the  superior aspect of the L5 lamina bilaterally. Ligament flavum was elevated and resected thecal fashion using Kerrison rongeurs. Underlying thecal sac and L5 nerve roots were then applied. Bilateral discectomies were then performed at L4-5. Disc spaces were cleaned of disc material using various curettes. Disc spaces and distracted up to 11 mm with a 11 mm distractor on the patient's right side the patient's thecal sac and nerve roots were protected on the left side. Disc space was given a final preparation and then an 11 mm x 23 mm x 8 cage packed with locally harvested autograft was impacted into position and turned into final place. The distractor was removed patient's right side. Disc space once again prepared on the right side. Morselized autograft was then packed into the interspace. A second 11 mm cage was impacted into place and rotated into final position. Cortical pedicle screws of L5 were then placed in a similar manner as above. 5.5 x 39 m screws were placed bilaterally at L5. Lateral facet joint complexes were decorticated here. Master graft was packed posterior laterally for later fusion. Short segment titanium rod was placed over the screw heads. Locking caps and placed over the screw heads. Locking caps and engaged and given a final tightening. Final images revealed good position the bone graft and hardware at proper upper level with normal alignment of the spine. Wound is irrigated one final time. Gelfoam was placed topically for hemostasis which found to be good. Vancomycin powder was placed in the deep wound space. Wounds and close in layers with Vicryls sutures. Steri-Strips and sterile dressing were applied. There were no apparent complications. The patient tolerated the procedure well and he returns to the recovery room postop.

## 2014-09-27 NOTE — Progress Notes (Signed)
Pt states he is still a 8/64m Dr Ola Spurr aware and order noted

## 2014-09-27 NOTE — H&P (Signed)
  Calix Heinbaugh is an 44 y.o. male.   Chief Complaint: Back pain HPI: 44 year old male with severe intractable back pain with progressive disc degeneration a central annular tear and a small central herniated nucleus pulposus at L4-5 presents for lumbar decompression and fusion. Patient has failed a long and extensive course of conservative management.  Past Medical History  Diagnosis Date  . GERD (gastroesophageal reflux disease)   . Arthritis     Past Surgical History  Procedure Laterality Date  . Nasal sinus surgery    . Upper gastrointestinal endoscopy      looking d/t acid reflux  . Hernia repair      X 2    No family history on file. Social History:  reports that he has been smoking E-cigarettes.  He has smoked for the past 1 year. He does not have any smokeless tobacco history on file. He reports that he drinks about 3.6 oz of alcohol per week. He reports that he does not use illicit drugs.  Allergies: No Known Allergies  Medications Prior to Admission  Medication Sig Dispense Refill  . calcium carbonate (TUMS) 500 MG chewable tablet Chew 1,000 mg by mouth daily.    . Esomeprazole Magnesium (NEXIUM PO) Take 22.5 mg by mouth daily.    . fluticasone (FLONASE) 50 MCG/ACT nasal spray Place 2 sprays into both nostrils at bedtime as needed for allergies.   11  . HYDROcodone-acetaminophen (NORCO) 10-325 MG per tablet Take 1 tablet by mouth every 6 (six) hours as needed (pain).   0  . ibuprofen (ADVIL,MOTRIN) 200 MG tablet Take 600 mg by mouth 2 (two) times daily as needed (pain).    . Iron-FA-B Cmp-C-Biot-Probiotic (FUSION PLUS) CAPS Take 1 capsule by mouth 2 (two) times daily.  3  . Tetrahydrozoline HCl (VISINE OP) Place 1 drop into both eyes daily as needed (dry eyes).      No results found for this or any previous visit (from the past 48 hour(s)). No results found.  A comprehensive review of systems was negative.  Blood pressure 140/94, pulse 74, temperature 97.6 F (36.4  C), temperature source Oral, resp. rate 20, weight 100.699 kg (222 lb), SpO2 97 %.  Patient is awake and alert. He is oriented and appropriate. Speech is fluent. Judgment and insight are intact. Cranial nerve function is intact. Motor and sensory function are intact. Reflexes are normal. Gait and posture reasonably normal. Examination head ears eyes and Thursdays unremarkable. Chest and abdomen are benign. Extremities are free from injury or deformity. Assessment/Plan L4-5 due to disc disease/central herniated mucous pulposis with intractable back pain. Plan L4-5 posterior lumbar interbody fusion utilizing interbody peek cages, locally harvested autograft, and supplemented with posterior lateral arthrodesis utilizing nonsegmental cortical pedicle screw instrumentation. Risks and benefits have been explained. Patient wishes to proceed.  Taylr Meuth A 09/27/2014, 1:36 PM

## 2014-09-27 NOTE — Transfer of Care (Signed)
Immediate Anesthesia Transfer of Care Note  Patient: Carlos Daugherty  Procedure(s) Performed: Procedure(s) with comments:  L4-L5 mas PLIF   (N/A) -  L4-L5 mas PLIF    Patient Location: PACU  Anesthesia Type:General  Level of Consciousness: awake, alert , oriented and patient cooperative  Airway & Oxygen Therapy: Patient Spontanous Breathing and Patient connected to face mask oxygen  Post-op Assessment: Report given to RN, Post -op Vital signs reviewed and stable and Patient moving all extremities X 4  Post vital signs: Reviewed and stable  Last Vitals:  Filed Vitals:   09/27/14 1214  BP: 140/94  Pulse: 74  Temp: 36.4 C  Resp: 20    Complications: No apparent anesthesia complications

## 2014-09-27 NOTE — Brief Op Note (Signed)
09/27/2014  4:04 PM  PATIENT:  Calla Kicks  44 y.o. male  PRE-OPERATIVE DIAGNOSIS:  DDD    HNP  POST-OPERATIVE DIAGNOSIS:  degenerative disc disease, herniated nucelus pulposus  PROCEDURE:  Procedure(s) with comments:  L4-L5 mas PLIF   (N/A) -  L4-L5 mas PLIF    SURGEON:  Surgeon(s) and Role:    * Earnie Larsson, MD - Primary  PHYSICIAN ASSISTANT:   ASSISTANTS: Jenkins   ANESTHESIA:   general  EBL:  Total I/O In: 1000 [I.V.:1000] Out: 395 [Urine:45; Blood:350]  BLOOD ADMINISTERED:none  DRAINS: none   LOCAL MEDICATIONS USED:  MARCAINE     SPECIMEN:  No Specimen  DISPOSITION OF SPECIMEN:  N/A  COUNTS:  YES  TOURNIQUET:  * No tourniquets in log *  DICTATION: .Dragon Dictation  PLAN OF CARE: Admit to inpatient   PATIENT DISPOSITION:  PACU - hemodynamically stable.   Delay start of Pharmacological VTE agent (>24hrs) due to surgical blood loss or risk of bleeding: yes

## 2014-09-27 NOTE — Anesthesia Procedure Notes (Signed)
Procedure Name: Intubation Date/Time: 09/27/2014 2:01 PM Performed by: Greggory Stallion, Kaylena Pacifico L Pre-anesthesia Checklist: Patient identified, Emergency Drugs available, Suction available, Patient being monitored and Timeout performed Patient Re-evaluated:Patient Re-evaluated prior to inductionOxygen Delivery Method: Circle system utilized Preoxygenation: Pre-oxygenation with 100% oxygen Intubation Type: IV induction Ventilation: Mask ventilation without difficulty Laryngoscope Size: Mac and 4 Grade View: Grade I Tube type: Oral Tube size: 8.0 mm Number of attempts: 1 Airway Equipment and Method: Stylet Placement Confirmation: ETT inserted through vocal cords under direct vision,  breath sounds checked- equal and bilateral and positive ETCO2 Secured at: 23 cm Tube secured with: Tape Dental Injury: Teeth and Oropharynx as per pre-operative assessment

## 2014-09-27 NOTE — Anesthesia Preprocedure Evaluation (Addendum)
Anesthesia Evaluation  Patient identified by MRN, date of birth, ID band Patient awake    Reviewed: Allergy & Precautions, NPO status , Patient's Chart, lab work & pertinent test results  Airway Mallampati: II  TM Distance: >3 FB Neck ROM: Full    Dental   Pulmonary Current Smoker,  breath sounds clear to auscultation        Cardiovascular negative cardio ROS  Rhythm:Regular Rate:Normal     Neuro/Psych negative neurological ROS     GI/Hepatic Neg liver ROS, GERD-  ,  Endo/Other  negative endocrine ROS  Renal/GU negative Renal ROS     Musculoskeletal  (+) Arthritis -,   Abdominal   Peds  Hematology negative hematology ROS (+)   Anesthesia Other Findings   Reproductive/Obstetrics                            Anesthesia Physical Anesthesia Plan  ASA: II  Anesthesia Plan: General   Post-op Pain Management:    Induction: Intravenous  Airway Management Planned: Oral ETT  Additional Equipment:   Intra-op Plan:   Post-operative Plan: Extubation in OR  Informed Consent: I have reviewed the patients History and Physical, chart, labs and discussed the procedure including the risks, benefits and alternatives for the proposed anesthesia with the patient or authorized representative who has indicated his/her understanding and acceptance.   Dental advisory given  Plan Discussed with: CRNA  Anesthesia Plan Comments:         Anesthesia Quick Evaluation

## 2014-09-28 ENCOUNTER — Encounter (HOSPITAL_COMMUNITY): Payer: Self-pay | Admitting: Neurosurgery

## 2014-09-28 MED ORDER — DIAZEPAM 5 MG PO TABS: 5.0000 mg | ORAL_TABLET | Freq: Four times a day (QID) | ORAL | Status: DC | PRN

## 2014-09-28 MED ORDER — OXYCODONE-ACETAMINOPHEN 5-325 MG PO TABS: 1.0000 | ORAL_TABLET | ORAL | Status: DC | PRN

## 2014-09-28 NOTE — Progress Notes (Signed)
Occupational Therapy Evaluation Patient Details Name: Carlos Daugherty MRN: [id number] DOB: February 26, 1971 Today's Date: 09/28/2014    History of Present Illness pt is a 44 y/o male admitted with ddd and small HNP at L 45, s/p PLIF and decompression   Clinical Impression   Patient presents to OT with decreased ADL independence as expected post-op with back precautions. All education has been completed and patient has no further OT needs at this time. OT will sign off.    Follow Up Recommendations  No OT follow up;Supervision - Intermittent    Equipment Recommendations  None recommended by OT    Recommendations for Other Services PT consult     Precautions / Restrictions Precautions Precautions: Back Precaution Comments: educated patient on all back precautions Required Braces or Orthoses: Spinal Brace Spinal Brace: Applied in sitting position Restrictions Weight Bearing Restrictions: No      Mobility Bed Mobility Overal bed mobility: Needs Assistance Bed Mobility: Rolling;Sit to Sidelying Rolling: Supervision Sidelying to sit: Min guard     Sit to sidelying: Supervision General bed mobility comments: log roll technique reinforced  Transfers Overall transfer level: Needs assistance Equipment used: None Transfers: Sit to/from Stand Sit to Stand: Supervision              Balance Overall balance assessment: Needs assistance Sitting-balance support: Feet supported Sitting balance-Leahy Scale: Good     Standing balance support: No upper extremity supported Standing balance-Leahy Scale: Good                              ADL Overall ADL's : Needs assistance/impaired Eating/Feeding: Independent;Sitting   Grooming: Wash/dry hands;Wash/dry face;Oral care;Supervision/safety;Sitting;Standing   Upper Body Bathing: Set up;Sitting   Lower Body Bathing: Minimal assistance;Sit to/from stand   Upper Body Dressing : Minimal assistance;Sitting   Lower Body  Dressing: Minimal assistance;Sit to/from stand   Toilet Transfer: Supervision/safety;Ambulation;Comfort height toilet;Grab bars   Toileting- Clothing Manipulation and Hygiene: Supervision/safety;Sit to/from stand       Functional mobility during ADLs: Supervision/safety General ADL Comments: Educated patient on back precautions. Patient able to cross legs over knees to access feet for LB bathing/dressing. Educated patient on don/doff brace. Educated patient on walk-in shower transfer method, but not practiced. Educated patient on no bending when standing at sink to brush teeth/wash face. Educated patient on toilet hygiene method and patient able to reach during simulation. Wife present for education. All questions answered.     Vision     Perception     Praxis      Pertinent Vitals/Pain Pain Assessment: 0-10 Pain Score: 8  Pain Location: back and leg Pain Descriptors / Indicators: Aching;Sore Pain Intervention(s): Monitored during session     Hand Dominance Right   Extremity/Trunk Assessment Upper Extremity Assessment Upper Extremity Assessment: Overall WFL for tasks assessed   Lower Extremity Assessment Lower Extremity Assessment: Defer to PT evaluation   Cervical / Trunk Assessment Cervical / Trunk Assessment: Normal   Communication Communication Communication: No difficulties   Cognition Arousal/Alertness: Awake/alert Behavior During Therapy: WFL for tasks assessed/performed Overall Cognitive Status: Within Functional Limits for tasks assessed                     General Comments       Exercises       Shoulder Instructions      Home Living Family/patient expects to be discharged to:: Private residence Living Arrangements: Spouse/significant other Available Help at  Discharge: Family Type of Home: House Home Access: Stairs to enter Technical brewer of Steps: 2 Entrance Stairs-Rails: None Home Layout: Two level;Bed/bath upstairs Alternate  Level Stairs-Number of Steps: 13 Alternate Level Stairs-Rails: Right Bathroom Shower/Tub: Occupational psychologist: Standard     Home Equipment: Cane - single point          Prior Functioning/Environment Level of Independence: Independent        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    OT Diagnosis: Acute pain   OT Problem List: Pain;Decreased knowledge of precautions   OT Treatment/Interventions:      OT Goals(Current goals can be found in the care plan section) Acute Rehab OT Goals Patient Stated Goal: eventually back to work OT Goal Formulation: All assessment and education complete, DC therapy  OT Frequency:     Barriers to D/C:            Co-evaluation              End of Session Equipment Utilized During Treatment: Back brace Nurse Communication: Mobility status  Activity Tolerance: Patient tolerated treatment well Patient left: in bed;with call bell/phone within reach;with family/visitor present   Time: 7096-4383 OT Time Calculation (min): 18 min Charges:  OT General Charges $OT Visit: 1 Procedure OT Evaluation $Initial OT Evaluation Tier I: 1 Procedure G-Codes:    Kayana Thoen A Oct 16, 2014, 10:16 AM

## 2014-09-28 NOTE — Evaluation (Signed)
Physical Therapy Evaluation Patient Details Name: Klye Besecker MRN: [id number] DOB: 1970/06/04 Today's Date: 09/28/2014   History of Present Illness  pt is a 44 y/o male admitted with ddd and small HNP at L 45, s/p PLIF and decompression  Clinical Impression  Pt is at or close to baseline functioning and should be safe at home with available family assist.  Back education completed.  There are no further acute PT needs.  Will sign off at this time.     Follow Up Recommendations No PT follow up    Equipment Recommendations  None recommended by PT    Recommendations for Other Services       Precautions / Restrictions Precautions Precautions: Back Required Braces or Orthoses: Spinal Brace Spinal Brace: Applied in supine position      Mobility  Bed Mobility Overal bed mobility: Needs Assistance Bed Mobility: Rolling;Sidelying to Sit;Sit to Sidelying Rolling: Min guard Sidelying to sit: Min guard     Sit to sidelying: Min guard General bed mobility comments: cued/demo'd and practiced log roll and transition side to/from sit  Transfers Overall transfer level: Needs assistance Equipment used: None Transfers: Sit to/from Stand Sit to Stand: Supervision            Ambulation/Gait Ambulation/Gait assistance: Supervision Ambulation Distance (Feet): 250 Feet Assistive device: None Gait Pattern/deviations: Step-through pattern Gait velocity: slower   General Gait Details: steady gait  Stairs Stairs: Yes Stairs assistance: Supervision Stair Management: One rail Right;Alternating pattern;Forwards Number of Stairs: 12 General stair comments: steady with rail  Wheelchair Mobility    Modified Rankin (Stroke Patients Only)       Balance Overall balance assessment: Needs assistance Sitting-balance support: Feet supported Sitting balance-Leahy Scale: Good     Standing balance support: No upper extremity supported Standing balance-Leahy Scale: Good                                Pertinent Vitals/Pain Pain Assessment: 0-10 Pain Score: 8  Pain Location: back down leg Pain Descriptors / Indicators: Aching;Grimacing Pain Intervention(s): Monitored during session    Home Living7/02/2015  Donnella Sham, [place] [phone]  (pager) Family/patient expects to be discharged to:: Private residence Living Arrangements: Spouse/significant other (school age children) Available Help at Discharge: Family (most of the day) Type of Home: House Home Access: Stairs to enter Entrance Stairs-Rails: None Entrance Stairs-Number of Steps: 2 Home Layout: Two level;Bed/bath upstairs Home Equipment: Cane - single point      Prior Function Level of Independence: Independent         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, newspaper        Extremity/Trunk Assessment   Upper Extremity Assessment: Defer to OT evaluation           Lower Extremity Assessment: Overall WFL for tasks assessed         Communication   Communication: No difficulties  Cognition Arousal/Alertness: Awake/alert Behavior During Therapy: WFL for tasks assessed/performed Overall Cognitive Status: Within Functional Limits for tasks assessed                      General Comments General comments (skin integrity, edema, etc.): pt instructed in back prec/care, log roll, transition side to/from sit, lifting restrictions, bracing issues, progression of activity.    Exercises        Assessment/Plan  PT Assessment Patent does not need any further PT services  PT Diagnosis Acute pain   PT Problem List    PT Treatment Interventions     PT Goals (Current goals can be found in the Care Plan section) Acute Rehab PT Goals Patient Stated Goal: eventually back to work PT Goal Formulation: With patient Time For Goal Achievement: 09/28/14    Frequency     Barriers to discharge         Co-evaluation               End of Session Equipment Utilized During Treatment: Back brace Activity Tolerance: Patient tolerated treatment well Patient left: Other (comment);with family/visitor present (with OT) Nurse Communication: Mobility status         Time: 2111-7356 PT Time Calculation (min) (ACUTE ONLY): 22 min   Charges:   PT Evaluation $Initial PT Evaluation Tier I: 1 Procedure     PT G Codes:        Anett Ranker, Tessie Fass 09/28/2014, 9:51 AM

## 2014-09-28 NOTE — Anesthesia Postprocedure Evaluation (Signed)
  Anesthesia Post-op Note  Patient: Carlos Daugherty  Procedure(s) Performed: Procedure(s) with comments:  L4-L5 mas PLIF   (N/A) -  L4-L5 mas PLIF    Patient Location: PACU  Anesthesia Type:General  Level of Consciousness: awake, alert  and oriented  Airway and Oxygen Therapy: Patient Spontanous Breathing  Post-op Pain: moderate  Post-op Assessment: Post-op Vital signs reviewed   LLE Sensation: Full sensation   RLE Sensation: Full sensation      Post-op Vital Signs: Reviewed  Last Vitals:  Filed Vitals:   09/28/14 0815  BP: 135/88  Pulse: 76  Temp: 36.7 C  Resp: 16    Complications: No apparent anesthesia complications

## 2014-09-28 NOTE — Discharge Instructions (Signed)
Wound Care °Keep incision covered and dry for two days.  If you shower, cover incision with plastic wrap.  °Do not put any creams, lotions, or ointments on incision. °Leave steri-strips on back.  They will fall off by themselves. °Activity °Walk each and every day, increasing distance each day. °No lifting greater than 5 lbs.  Avoid excessive neck motion. °No driving for 2 weeks; may ride as a passenger locally. °If provided with back brace, wear when out of bed.  It is not necessary to wear brace in bed. °Diet °Resume your normal diet.  °Return to Work °Will be discussed at you follow up appointment. °Call Your Doctor If Any of These Occur °Redness, drainage, or swelling at the wound.  °Temperature greater than 101 degrees. °Severe pain not relieved by pain medication. °Incision starts to come apart. °Follow Up Appt °Call today for appointment in 1-2 weeks ([phone]) or for problems.  If you have any hardware placed in your spine, you will need an x-ray before your appointment. ° ° °Spinal Fusion °Spinal fusion is a procedure to make 2 or more of the bones in your spinal column (vertebrae) grow together (fuse). This procedure stops movement between the vertebrae and can relieve pain and prevent deformity.  °Spinal fusion is used to treat the following conditions: °· Fractures of the spine. °· Herniated disk (the spongy material [cartilage] between the vertebrae). °· Abnormal curvatures of the spine, such as scoliosis or kyphosis. °· A weak or an unstable spine, caused by infections or tumor. °RISKS AND COMPLICATIONS °Complications associated with spinal fusion are rare, but they can occur. Possible complications include: °· Bleeding. °· Infection near the incision. °· Nerve damage. Signs of nerve damage are back pain, pain in one or both legs, weakness, or numbness. °· Spinal fluid leakage. °· Blood clot in your leg, which can move to your lungs. °· Difficulty controlling urination or bowel movements. °BEFORE THE  PROCEDURE °· A medical evaluation will be done. This will include a physical exam, blood tests, and imaging exams. °· You will talk with an anesthesiologist. This is the person who will be in charge of the anesthesia during the procedure. Spinal fusion usually requires that you are asleep during the procedure (general anesthesia). °· You will need to stop taking certain medicines, particularly those associated with an increased risk of bleeding. Ask your caregiver about changing or stopping your regular medicines. °· If you smoke, you will need to stop at least 2 weeks before the procedure. Smoking can slow down the healing process, especially fusion of the vertebrae, and increase the risk of complications. °· Do not eat or drink anything for at least 8 hours before the procedure. °PROCEDURE  °A cut (incision) is made over the vertebrae that will be fused. The back muscles are separated from the vertebrae. If you are having this procedure to treat a herniated disk, the disc material pressing on the nerve root is removed (decompression). The area where the disk is removed is then filled with extra bone. Bone from another part of your body (autogenous bone) or bone from a bone donor (allograft bone) may be used. The extra bone promotes fusion between the vertebrae. Sometimes, specific medicines are added to the fusion area to promote bone healing. In most cases, screws and rods or metal plates will be used to attach the vertebrae to stabilize them while they fuse.  °AFTER THE PROCEDURE  °· You will stay in a recovery area until the anesthesia has worn   off. Your blood pressure and pulse will be checked frequently. °· You will be given antibiotics to prevent infection. °· You may continue to receive fluids through an intravenous (IV) tube while you are still in the hospital. °· Pain after surgery is normal. You will be given pain medicine. °· You will be taught how to move correctly and how to stand and walk. While in  bed, you will be instructed to turn frequently, using a "log rolling" technique, in which the entire body is moved without twisting the back. °Document Released: 12/02/2002 Document Revised: 05/28/2011 Document Reviewed: 05/18/2010 °[employer]® Patient Information ©2015 [employer]. This information is not intended to replace advice given to you by your health care provider. Make sure you discuss any questions you have with your health care provider. ° °

## 2014-09-28 NOTE — Progress Notes (Signed)
Pt and wife given D/C instructions with Rx's, verbal understanding was provided. Pt's incision is covered with Honeycomb dressing. Pt's IV was removed prior to D/C. Pt D/C'd home via wheelchair @ 1140 per MD order. Pt is stable @ D/C and has no other needs at this time. Holli Humbles, RN

## 2014-09-28 NOTE — Discharge Summary (Signed)
Physician Discharge Summary  Patient ID: Carlos Daugherty MRN: [id number] DOB/AGE: [birth year] 44 y.o.  Admit date: 09/27/2014 Discharge date: 09/28/2014  Admission Diagnoses:  Discharge Diagnoses:  Principal Problem:   DDD (degenerative disc disease), lumbar   Discharged Condition: good  Hospital Course: The patient was admitted to the hospital where he underwent an uncomplicated B7-6 decompression and fusion. Postoperatively he is doing very well. Preoperative back and leg pain are much improved. He is standing and ambulating without difficulty. He is ready for discharge home.  Consults:   Significant Diagnostic Studies:   Treatments:   Discharge Exam: Blood pressure 135/88, pulse 76, temperature 98.1 F (36.7 C), temperature source Oral, resp. rate 16, weight 100.699 kg (222 lb), SpO2 98 %. Awake and alert. Oriented and appropriate. Motor and sensory function intact. Wound clean and dry. Chest and abdomen benign.  Disposition: Final discharge disposition not confirmed     Medication List    TAKE these medications        diazepam 5 MG tablet  Commonly known as:  VALIUM  Take 1-2 tablets (5-10 mg total) by mouth every 6 (six) hours as needed for muscle spasms.     fluticasone 50 MCG/ACT nasal spray  Commonly known as:  FLONASE  Place 2 sprays into both nostrils at bedtime as needed for allergies.     FUSION PLUS Caps  Take 1 capsule by mouth 2 (two) times daily.     HYDROcodone-acetaminophen 10-325 MG per tablet  Commonly known as:  NORCO  Take 1 tablet by mouth every 6 (six) hours as needed (pain).     ibuprofen 200 MG tablet  Commonly known as:  ADVIL,MOTRIN  Take 600 mg by mouth 2 (two) times daily as needed (pain).     NEXIUM PO  Take 22.5 mg by mouth daily.     oxyCODONE-acetaminophen 5-325 MG per tablet  Commonly known as:  PERCOCET/ROXICET  Take 1-2 tablets by mouth every 4 (four) hours as needed for moderate pain.     TUMS 500 MG chewable tablet   Generic drug:  calcium carbonate  Chew 1,000 mg by mouth daily.     VISINE OP  Place 1 drop into both eyes daily as needed (dry eyes).           Follow-up Information    Follow up with Charlie Pitter, MD.   Specialty:  Neurosurgery   Contact information:   [address] [place] [address] [phone]       Signed: Charlie Pitter 09/28/2014, 10:17 AM

## 2015-01-05 ENCOUNTER — Other Ambulatory Visit: Payer: Self-pay | Admitting: Hematology & Oncology

## 2015-05-05 ENCOUNTER — Other Ambulatory Visit: Payer: Self-pay | Admitting: Family Medicine

## 2015-05-05 DIAGNOSIS — R748 Abnormal levels of other serum enzymes: Secondary | ICD-10-CM

## 2015-05-10 ENCOUNTER — Ambulatory Visit
Admission: RE | Admit: 2015-05-10 | Discharge: 2015-05-10 | Disposition: A | Discharge status: Home / Self Care | Payer: BLUE CROSS/BLUE SHIELD | Source: Ambulatory Visit | Attending: Family Medicine | Admitting: Family Medicine

## 2015-05-10 DIAGNOSIS — R748 Abnormal levels of other serum enzymes: Secondary | ICD-10-CM

## 2015-10-01 ENCOUNTER — Other Ambulatory Visit: Payer: Self-pay | Admitting: Neurosurgery

## 2015-10-01 DIAGNOSIS — M5416 Radiculopathy, lumbar region: Secondary | ICD-10-CM

## 2015-10-15 ENCOUNTER — Ambulatory Visit
Admission: RE | Admit: 2015-10-15 | Discharge: 2015-10-15 | Disposition: A | Discharge status: Home / Self Care | Payer: BLUE CROSS/BLUE SHIELD | Source: Ambulatory Visit | Attending: Neurosurgery | Admitting: Neurosurgery

## 2015-10-15 DIAGNOSIS — M5416 Radiculopathy, lumbar region: Secondary | ICD-10-CM

## 2015-10-15 MED ORDER — GADOBENATE DIMEGLUMINE 529 MG/ML IV SOLN
19.0000 mL | Freq: Once | INTRAVENOUS | Status: AC | PRN
  Administered 2015-10-15: 19 mL via INTRAVENOUS

## 2016-12-21 DIAGNOSIS — R030 Elevated blood-pressure reading, without diagnosis of hypertension: Secondary | ICD-10-CM | POA: Diagnosis not present

## 2016-12-21 DIAGNOSIS — M5126 Other intervertebral disc displacement, lumbar region: Secondary | ICD-10-CM | POA: Diagnosis not present

## 2016-12-21 DIAGNOSIS — M5416 Radiculopathy, lumbar region: Secondary | ICD-10-CM | POA: Diagnosis not present

## 2018-05-13 DIAGNOSIS — I10 Essential (primary) hypertension: Secondary | ICD-10-CM | POA: Diagnosis not present

## 2018-05-13 DIAGNOSIS — K649 Unspecified hemorrhoids: Secondary | ICD-10-CM | POA: Diagnosis not present

## 2018-05-28 ENCOUNTER — Encounter (HOSPITAL_BASED_OUTPATIENT_CLINIC_OR_DEPARTMENT_OTHER): Payer: Self-pay

## 2018-05-28 ENCOUNTER — Emergency Department (HOSPITAL_BASED_OUTPATIENT_CLINIC_OR_DEPARTMENT_OTHER): Payer: BLUE CROSS/BLUE SHIELD

## 2018-05-28 ENCOUNTER — Inpatient Hospital Stay (HOSPITAL_BASED_OUTPATIENT_CLINIC_OR_DEPARTMENT_OTHER)
Admission: EM | Admit: 2018-05-28 | Discharge: 2018-05-31 | DRG: 346 | Disposition: A | Discharge status: Home / Self Care | Payer: BLUE CROSS/BLUE SHIELD | Attending: Surgery | Admitting: Surgery

## 2018-05-28 ENCOUNTER — Other Ambulatory Visit: Payer: Self-pay

## 2018-05-28 DIAGNOSIS — Z79899 Other long term (current) drug therapy: Secondary | ICD-10-CM

## 2018-05-28 DIAGNOSIS — K610 Anal abscess: Secondary | ICD-10-CM | POA: Diagnosis not present

## 2018-05-28 DIAGNOSIS — K6131 Horseshoe abscess: Secondary | ICD-10-CM | POA: Diagnosis not present

## 2018-05-28 DIAGNOSIS — R911 Solitary pulmonary nodule: Secondary | ICD-10-CM | POA: Diagnosis not present

## 2018-05-28 DIAGNOSIS — Z720 Tobacco use: Secondary | ICD-10-CM

## 2018-05-28 DIAGNOSIS — K6139 Other ischiorectal abscess: Principal | ICD-10-CM | POA: Diagnosis present

## 2018-05-28 DIAGNOSIS — K219 Gastro-esophageal reflux disease without esophagitis: Secondary | ICD-10-CM | POA: Diagnosis present

## 2018-05-28 DIAGNOSIS — I10 Essential (primary) hypertension: Secondary | ICD-10-CM | POA: Diagnosis not present

## 2018-05-28 DIAGNOSIS — F17210 Nicotine dependence, cigarettes, uncomplicated: Secondary | ICD-10-CM | POA: Diagnosis not present

## 2018-05-28 DIAGNOSIS — M1990 Unspecified osteoarthritis, unspecified site: Secondary | ICD-10-CM | POA: Diagnosis not present

## 2018-05-28 DIAGNOSIS — K611 Rectal abscess: Secondary | ICD-10-CM | POA: Diagnosis not present

## 2018-05-28 DIAGNOSIS — K649 Unspecified hemorrhoids: Secondary | ICD-10-CM | POA: Diagnosis not present

## 2018-05-28 LAB — BASIC METABOLIC PANEL
Anion gap: 11 (ref 5–15)
BUN: 12 mg/dL (ref 6–20)
CALCIUM: 9.7 mg/dL (ref 8.9–10.3)
CO2: 19 mmol/L — ABNORMAL LOW (ref 22–32)
CREATININE: 0.86 mg/dL (ref 0.61–1.24)
Chloride: 104 mmol/L (ref 98–111)
GFR calc non Af Amer: >60 mL/min (ref >60)
Glucose, Bld: 73 mg/dL (ref 70–99)
Potassium: 3.4 mmol/L — ABNORMAL LOW (ref 3.5–5.1)
Sodium: 134 mmol/L — ABNORMAL LOW (ref 135–145)

## 2018-05-28 LAB — URINALYSIS, ROUTINE W REFLEX MICROSCOPIC
Bilirubin Urine: NEGATIVE
Glucose, UA: NEGATIVE mg/dL
Hgb urine dipstick: NEGATIVE
Ketones, ur: NEGATIVE mg/dL
Leukocytes,Ua: NEGATIVE
NITRITE: NEGATIVE
Protein, ur: NEGATIVE mg/dL
Specific Gravity, Urine: <1.005 — ABNORMAL LOW (ref 1.005–1.030)
pH: 6 (ref 5.0–8.0)

## 2018-05-28 LAB — CBC
HCT: 38.1 % — ABNORMAL LOW (ref 39.0–52.0)
Hemoglobin: 11 g/dL — ABNORMAL LOW (ref 13.0–17.0)
MCH: 22.7 pg — ABNORMAL LOW (ref 26.0–34.0)
MCHC: 28.9 g/dL — ABNORMAL LOW (ref 30.0–36.0)
MCV: 78.7 fL — ABNORMAL LOW (ref 80.0–100.0)
Platelets: 299 10*3/uL (ref 150–400)
RBC: 4.84 MIL/uL (ref 4.22–5.81)
RDW: 17.7 % — ABNORMAL HIGH (ref 11.5–15.5)
WBC: 13.5 10*3/uL — ABNORMAL HIGH (ref 4.0–10.5)
nRBC: 0 % (ref 0.0–0.2)

## 2018-05-28 MED ORDER — SODIUM CHLORIDE 0.9 % IV BOLUS (SEPSIS)
1000.0000 mL | Freq: Once | INTRAVENOUS | Status: AC
  Administered 2018-05-28: 1000 mL via INTRAVENOUS

## 2018-05-28 MED ORDER — ONDANSETRON HCL 4 MG/2ML IJ SOLN
4.0000 mg | Freq: Once | INTRAMUSCULAR | Status: AC
  Administered 2018-05-28: 4 mg via INTRAVENOUS
  Filled 2018-05-28: qty 2

## 2018-05-28 MED ORDER — MORPHINE SULFATE (PF) 4 MG/ML IV SOLN
4.0000 mg | Freq: Once | INTRAVENOUS | Status: AC
  Administered 2018-05-28: 4 mg via INTRAVENOUS
  Filled 2018-05-28: qty 1

## 2018-05-28 MED ORDER — IOHEXOL 300 MG/ML  SOLN
100.0000 mL | Freq: Once | INTRAMUSCULAR | Status: AC | PRN
  Administered 2018-05-28: 100 mL via INTRAVENOUS

## 2018-05-28 MED ORDER — SODIUM CHLORIDE 0.9 % IV SOLN
2.0000 g | Freq: Once | INTRAVENOUS | Status: AC
  Administered 2018-05-29: 2 g via INTRAVENOUS
  Filled 2018-05-28: qty 2

## 2018-05-28 MED ORDER — SODIUM CHLORIDE 0.9 % IV SOLN
1000.0000 mL | INTRAVENOUS | Status: DC
  Administered 2018-05-29 – 2018-05-30 (×3): 1000 mL via INTRAVENOUS

## 2018-05-28 NOTE — ED Provider Notes (Addendum)
[place] EMERGENCY DEPARTMENT Provider Note   CSN: [id number] Arrival date & time: 05/28/18  2108    History   Chief Complaint Chief Complaint  Patient presents with  . Hemorrhoids    HPI Carlos Daugherty is a 48 y.o. male.     HPI Pt started having rectal pain several weeks ago.  When it first started he felt feverish.  That resolved and he went to see his PCP.  He was diagnosed with hemorrhoids and was started on proctofoam.  Pt noted improvement with that treatment.  His sx returned after the rx ran out and when he started using the refill it stopped being as effective.  His sx have increased and his doctor referred him to general surgery.  He has not heard from them yet and now his sx are getting worse.  It is painful to walk.  The pain is in the rectal area.   He also feels like it is making it hard from him to urinate. Past Medical History:  Diagnosis Date  . Arthritis   . GERD (gastroesophageal reflux disease)     Patient Active Problem List   Diagnosis Date Noted  . DDD (degenerative disc disease), lumbar 09/27/2014    Past Surgical History:  Procedure Laterality Date  . HERNIA REPAIR     X 2  . MAXIMUM ACCESS (MAS)POSTERIOR LUMBAR INTERBODY FUSION (PLIF) 1 LEVEL N/A 09/27/2014   Procedure:  L4-L5 mas PLIF  ;  Surgeon: Earnie Larsson, MD;  Location: [facility];  Service: Neurosurgery;  Laterality: N/A;   L4-L5 mas PLIF    . NASAL SINUS SURGERY    . UPPER GASTROINTESTINAL ENDOSCOPY     looking d/t acid reflux        Home Medications    Prior to Admission medications   Medication Sig Start Date End Date Taking? Authorizing Provider  calcium carbonate (TUMS) 500 MG chewable tablet Chew 1,000 mg by mouth daily.    [provider]  diazepam (VALIUM) 5 MG tablet Take 1-2 tablets (5-10 mg total) by mouth every 6 (six) hours as needed for muscle spasms. 09/28/14   Earnie Larsson, MD  Esomeprazole Magnesium (NEXIUM PO) Take 22.5 mg by mouth daily.     [provider]  fluticasone (FLONASE) 50 MCG/ACT nasal spray Place 2 sprays into both nostrils at bedtime as needed for allergies.  04/29/14   [provider]  HYDROcodone-acetaminophen (NORCO) 10-325 MG per tablet Take 1 tablet by mouth every 6 (six) hours as needed (pain).  06/28/14   [provider]  ibuprofen (ADVIL,MOTRIN) 200 MG tablet Take 600 mg by mouth 2 (two) times daily as needed (pain).    [provider]  Iron-FA-B Cmp-C-Biot-Probiotic (FUSION PLUS) CAPS TAKE 1 CAPSULE BY MOUTH TWICE DAILY 01/05/15   [name], MD  oxyCODONE-acetaminophen (PERCOCET/ROXICET) 5-325 MG per tablet Take 1-2 tablets by mouth every 4 (four) hours as needed for moderate pain. 09/28/14   Earnie Larsson, MD  Tetrahydrozoline HCl (VISINE OP) Place 1 drop into both eyes daily as needed (dry eyes).    [provider]    Family History No family history on file.  Social History Social History   Tobacco Use  . Smoking status: Current Every Day Smoker    Years: 1.00    Types: Cigarettes  . Smokeless tobacco: Never Used  Substance Use Topics  . Alcohol use: Yes    Comment: weekly  . Drug use: No  Allergies   Patient has no known allergies.   Review of Systems Review of Systems  All other systems reviewed and are negative.    Physical Exam Updated Vital Signs BP 116/88 (BP Location: Left Arm)   Pulse (!) 126   Temp 98.2 F (36.8 C) (Oral)   Resp (!) 24   Ht 1.93 m (6\' 4" )   Wt 91.2 kg   SpO2 99%   BMI 24.47 kg/m   Physical Exam Vitals signs and nursing note reviewed.  Constitutional:      Appearance: He is well-developed. He is ill-appearing.  HENT:     Head: Normocephalic and atraumatic.     Right Ear: External ear normal.     Left Ear: External ear normal.  Eyes:     General: No scleral icterus.       Right eye: No discharge.        Left eye: No discharge.     Conjunctiva/sclera: Conjunctivae normal.  Neck:      Musculoskeletal: Neck supple.     Trachea: No tracheal deviation.  Cardiovascular:     Rate and Rhythm: Normal rate and regular rhythm.  Pulmonary:     Effort: Pulmonary effort is normal. No respiratory distress.     Breath sounds: Normal breath sounds. No stridor. No wheezing or rales.  Abdominal:     General: Bowel sounds are normal. There is no distension.     Palpations: Abdomen is soft.     Tenderness: There is no abdominal tenderness. There is no guarding or rebound.  Genitourinary:    Comments: Pain on rectal exam, no internal mass, small external skin tags, non thrombosed non tender hemorrhoidal tissue Musculoskeletal:        General: No tenderness.  Skin:    General: Skin is warm and dry.     Findings: No rash.  Neurological:     Mental Status: He is alert.     Cranial Nerves: No cranial nerve deficit (no facial droop, extraocular movements intact, no slurred speech).     Sensory: No sensory deficit.     Motor: No abnormal muscle tone or seizure activity.     Coordination: Coordination normal.      ED Treatments / Results  Labs (all labs ordered are listed, but only abnormal results are displayed) Labs Reviewed  CBC - Abnormal; Notable for the following components:      Result Value   WBC 13.5 (*)    Hemoglobin 11.0 (*)    HCT 38.1 (*)    MCV 78.7 (*)    MCH 22.7 (*)    MCHC 28.9 (*)    RDW 17.7 (*)    All other components within normal limits  BASIC METABOLIC PANEL - Abnormal; Notable for the following components:   Sodium 134 (*)    Potassium 3.4 (*)    CO2 19 (*)    All other components within normal limits  URINALYSIS, ROUTINE W REFLEX MICROSCOPIC - Abnormal; Notable for the following components:   Specific Gravity, Urine <1.005 (*)    All other components within normal limits     Radiology Ct Abdomen Pelvis W Contrast  Result Date: 05/28/2018 CLINICAL DATA:  48 year old male with perianal abscess, recent fever. EXAM: CT ABDOMEN AND PELVIS WITH  CONTRAST TECHNIQUE: Multidetector CT imaging of the abdomen and pelvis was performed using the standard protocol following bolus administration of intravenous contrast. CONTRAST:  193mL OMNIPAQUE IOHEXOL 300 MG/ML  SOLN COMPARISON:  Lumbar MRI 10/15/2015. FINDINGS:  Lower chest: 5 millimeter right lower lobe lateral basal segment subpleural lung nodule which is likely benign. Tiny nodular density along the left major fissure on series 3, image 6 is probably a benign intrapleural lymph node. Negative lung bases otherwise. No pericardial or pleural effusion. Hepatobiliary: Negative liver and gallbladder. Pancreas: Negative. Spleen: Negative. Adrenals/Urinary Tract: Normal adrenal glands. Bilateral renal enhancement and contrast excretion is symmetric and normal. Negative ureters. Unremarkable urinary bladder. Stomach/Bowel: There is a complex multilocular perianal abscess. The largest component is to the right of midline, tracks cephalad, contains an air-fluid level, and encompasses 60 x 32 x 57 millimeters (estimated volume 54 milliliters). A smaller posterior and left of midline component is about 4 centimeters. The 2 components appear to meet posteriorly (7 o'clock position. Both components are demonstrated on series 2, image 107. On coronal image 73 there is inflammation surrounding the larger collection which is contiguous with the pelvic floor. However, there is no pelvic free fluid or other complicating feature. Proximal to the anal verge the rectum appears normal. Negative sigmoid colon. Other large bowel segments are negative aside from retained stool. Normal appendix. Oral contrast has not yet reached the distal small bowel. The TI is normal. No dilated small bowel loops. Negative stomach and duodenum. No free air or free fluid. Vascular/Lymphatic: Mild Aortoiliac calcified atherosclerosis. Major arterial structures are patent. Portal venous system is patent. No lymphadenopathy, but there is a mildly reactive  appearing right external iliac lymph node which is asymmetric measuring 13 millimeters on series 2, image 94. Reproductive: Negative. Other: None. Musculoskeletal: Previous lumbar fusion. No acute osseous abnormality identified. IMPRESSION: 1. Complex and large multiloculated perianal abscess. The largest component is on the right of midline, tracks toward the pelvic floor, and has an estimated volume of 54 mL. No pelvic free fluid or other complicating features. This was discussed by telephone with Dr. Dorie Rank on 05/28/2018 at 23:54 . 2. Subpleural 5 mm right lower lobe nodule which is likely benign. No follow-up needed if patient is low-risk. Non-contrast chest CT can be considered in 12 months if patient is high-risk. This recommendation follows the consensus statement: Guidelines for Management of Incidental Pulmonary Nodules Detected on CT Images: From the Fleischner Society 2017; Radiology 2017; 284:228-243. Electronically Signed   By: Genevie Ann M.D.   On: 05/28/2018 23:58    Procedures Procedures (including critical care time)  Medications Ordered in ED Medications  sodium chloride 0.9 % bolus 1,000 mL (0 mLs Intravenous Stopped 05/28/18 2329)    Followed by  0.9 %  sodium chloride infusion (has no administration in time range)  cefOXitin (MEFOXIN) 2 g in sodium chloride 0.9 % 100 mL IVPB (has no administration in time range)  sodium chloride 0.9 % bolus 1,000 mL (has no administration in time range)  0.9 %  sodium chloride infusion (has no administration in time range)  ondansetron (ZOFRAN) injection 4 mg (4 mg Intravenous Given 05/28/18 2201)  morphine 4 MG/ML injection 4 mg (4 mg Intravenous Given 05/28/18 2204)  iohexol (OMNIPAQUE) 300 MG/ML solution 100 mL (100 mLs Intravenous Contrast Given 05/28/18 2307)  morphine 4 MG/ML injection 4 mg (4 mg Intravenous Given 05/28/18 2357)     Initial Impression / Assessment and Plan / ED Course  I have reviewed the triage vital signs and the nursing  notes.  Pertinent labs & imaging results that were available during my care of the patient were reviewed by me and considered in my medical decision making (see chart  for details).  Clinical Course as of May 29 2  Thu May 29, 2018  0003 CT scan shows a large perianal abscess.   [JK]    Clinical Course User Index [JK] Dorie Rank, MD   Patient does not have a significant thrombosed hemorrhoid on exam.  The small amount of hemorrhoidal tissue he has would not account for his pain and discomfort.  I am concerned about the possibility of abscess or other intra-abdominal pathology.  Plan on CT scan for further evaluation.   Patient CT scan demonstrates a large perianal abscess.  Patient is afebrile but he does have a leukocytosis and tachycardia.  No signs of sepsis but I will start antibiotics.  I will consult with general surgery to arrange for transfer and for admission and surgical management.  Final Clinical Impressions(s) / ED Diagnoses   Final diagnoses:  Perianal abscess  Pulmonary nodule       Dorie Rank, MD 05/29/18 0006   D/w Dr Johney Maine.  [facility] ED   Dorie Rank, MD 05/29/18 [phone]

## 2018-05-28 NOTE — ED Triage Notes (Signed)
Pt c/o "hemorrhoid" pain x 5 weeks-states he was seen by PCP with general surgery referral-pt states he has not heard from surgery yet-entered triage grimacing

## 2018-05-29 ENCOUNTER — Encounter (HOSPITAL_COMMUNITY): Admission: EM | Disposition: A | Discharge status: Home / Self Care | Payer: Self-pay | Source: Home / Self Care

## 2018-05-29 ENCOUNTER — Encounter (HOSPITAL_COMMUNITY): Payer: Self-pay

## 2018-05-29 ENCOUNTER — Inpatient Hospital Stay (HOSPITAL_COMMUNITY): Payer: BLUE CROSS/BLUE SHIELD | Admitting: Certified Registered Nurse Anesthetist

## 2018-05-29 DIAGNOSIS — K6131 Horseshoe abscess: Secondary | ICD-10-CM | POA: Diagnosis present

## 2018-05-29 DIAGNOSIS — K219 Gastro-esophageal reflux disease without esophagitis: Secondary | ICD-10-CM

## 2018-05-29 DIAGNOSIS — I10 Essential (primary) hypertension: Secondary | ICD-10-CM | POA: Diagnosis not present

## 2018-05-29 DIAGNOSIS — M1990 Unspecified osteoarthritis, unspecified site: Secondary | ICD-10-CM | POA: Diagnosis present

## 2018-05-29 DIAGNOSIS — K649 Unspecified hemorrhoids: Secondary | ICD-10-CM | POA: Diagnosis present

## 2018-05-29 DIAGNOSIS — K610 Anal abscess: Secondary | ICD-10-CM | POA: Diagnosis present

## 2018-05-29 DIAGNOSIS — K6139 Other ischiorectal abscess: Secondary | ICD-10-CM | POA: Diagnosis present

## 2018-05-29 DIAGNOSIS — Z79899 Other long term (current) drug therapy: Secondary | ICD-10-CM | POA: Diagnosis not present

## 2018-05-29 DIAGNOSIS — Z720 Tobacco use: Secondary | ICD-10-CM

## 2018-05-29 DIAGNOSIS — K611 Rectal abscess: Secondary | ICD-10-CM | POA: Diagnosis not present

## 2018-05-29 DIAGNOSIS — F17210 Nicotine dependence, cigarettes, uncomplicated: Secondary | ICD-10-CM | POA: Diagnosis present

## 2018-05-29 HISTORY — PX: INCISION AND DRAINAGE PERIRECTAL ABSCESS: SHX1804

## 2018-05-29 LAB — SURGICAL PCR SCREEN
MRSA, PCR: NEGATIVE
Staphylococcus aureus: POSITIVE — AB

## 2018-05-29 LAB — HIV ANTIBODY (ROUTINE TESTING W REFLEX): HIV Screen 4th Generation wRfx: NONREACTIVE

## 2018-05-29 SURGERY — INCISION AND DRAINAGE, ABSCESS, PERIRECTAL
Anesthesia: General

## 2018-05-29 MED ORDER — FENTANYL CITRATE (PF) 250 MCG/5ML IJ SOLN
INTRAMUSCULAR | Status: DC | PRN
  Administered 2018-05-29 (×2): 25 ug via INTRAVENOUS
  Administered 2018-05-29 (×2): 50 ug via INTRAVENOUS

## 2018-05-29 MED ORDER — LIP MEDEX EX OINT
1.0000 "application " | TOPICAL_OINTMENT | Freq: Two times a day (BID) | CUTANEOUS | Status: DC
  Administered 2018-05-29 – 2018-05-30 (×4): 1 via TOPICAL
  Filled 2018-05-29 (×4): qty 7

## 2018-05-29 MED ORDER — CHLORHEXIDINE GLUCONATE CLOTH 2 % EX PADS
6.0000 | MEDICATED_PAD | Freq: Once | CUTANEOUS | Status: AC
  Administered 2018-05-29: 6 via TOPICAL

## 2018-05-29 MED ORDER — METOPROLOL TARTRATE 12.5 MG HALF TABLET: 12.5000 mg | ORAL_TABLET | Freq: Two times a day (BID) | ORAL | Status: DC | PRN

## 2018-05-29 MED ORDER — LIDOCAINE 2% (20 MG/ML) 5 ML SYRINGE
INTRAMUSCULAR | Status: AC
  Filled 2018-05-29: qty 5

## 2018-05-29 MED ORDER — PHENYLEPHRINE 40 MCG/ML (10ML) SYRINGE FOR IV PUSH (FOR BLOOD PRESSURE SUPPORT)
PREFILLED_SYRINGE | INTRAVENOUS | Status: AC
  Filled 2018-05-29: qty 10

## 2018-05-29 MED ORDER — MIDAZOLAM HCL 2 MG/2ML IJ SOLN
INTRAMUSCULAR | Status: AC
  Filled 2018-05-29: qty 2

## 2018-05-29 MED ORDER — ONDANSETRON HCL 4 MG/2ML IJ SOLN: 4.0000 mg | Freq: Four times a day (QID) | INTRAMUSCULAR | Status: DC | PRN

## 2018-05-29 MED ORDER — SIMETHICONE 80 MG PO CHEW
40.0000 mg | CHEWABLE_TABLET | Freq: Four times a day (QID) | ORAL | Status: DC | PRN
  Administered 2018-05-30: 40 mg via ORAL
  Filled 2018-05-29 (×2): qty 1

## 2018-05-29 MED ORDER — PROPOFOL 10 MG/ML IV BOLUS
INTRAVENOUS | Status: DC | PRN
  Administered 2018-05-29: 200 mg via INTRAVENOUS

## 2018-05-29 MED ORDER — GABAPENTIN 300 MG PO CAPS
300.0000 mg | ORAL_CAPSULE | ORAL | Status: AC
  Administered 2018-05-29: 300 mg via ORAL
  Filled 2018-05-29: qty 1

## 2018-05-29 MED ORDER — GABAPENTIN 300 MG PO CAPS
300.0000 mg | ORAL_CAPSULE | Freq: Two times a day (BID) | ORAL | Status: DC
  Administered 2018-05-29 – 2018-05-31 (×4): 300 mg via ORAL
  Filled 2018-05-29 (×4): qty 1

## 2018-05-29 MED ORDER — KETOROLAC TROMETHAMINE 30 MG/ML IJ SOLN
INTRAMUSCULAR | Status: AC
  Filled 2018-05-29: qty 1

## 2018-05-29 MED ORDER — CEFAZOLIN SODIUM-DEXTROSE 2-4 GM/100ML-% IV SOLN
2.0000 g | INTRAVENOUS | Status: AC
  Administered 2018-05-29: 2 g via INTRAVENOUS
  Filled 2018-05-29 (×2): qty 100

## 2018-05-29 MED ORDER — HYDROMORPHONE HCL 1 MG/ML IJ SOLN
INTRAMUSCULAR | Status: AC
  Filled 2018-05-29: qty 2

## 2018-05-29 MED ORDER — POLYETHYLENE GLYCOL 3350 17 G PO PACK: 17.0000 g | PACK | Freq: Two times a day (BID) | ORAL | Status: DC | PRN

## 2018-05-29 MED ORDER — PANTOPRAZOLE SODIUM 40 MG PO TBEC
40.0000 mg | DELAYED_RELEASE_TABLET | Freq: Every day | ORAL | Status: DC
  Administered 2018-05-30 – 2018-05-31 (×2): 40 mg via ORAL
  Filled 2018-05-29 (×2): qty 1

## 2018-05-29 MED ORDER — MAGIC MOUTHWASH
15.0000 mL | Freq: Four times a day (QID) | ORAL | Status: DC | PRN
  Filled 2018-05-29: qty 15

## 2018-05-29 MED ORDER — FENTANYL CITRATE (PF) 250 MCG/5ML IJ SOLN
INTRAMUSCULAR | Status: AC
  Filled 2018-05-29: qty 5

## 2018-05-29 MED ORDER — LACTATED RINGERS IV BOLUS
1000.0000 mL | Freq: Once | INTRAVENOUS | Status: AC
  Administered 2018-05-29: 1000 mL via INTRAVENOUS

## 2018-05-29 MED ORDER — FENTANYL CITRATE (PF) 100 MCG/2ML IJ SOLN
25.0000 ug | INTRAMUSCULAR | Status: DC | PRN
  Administered 2018-05-29 (×3): 50 ug via INTRAVENOUS

## 2018-05-29 MED ORDER — METOPROLOL TARTRATE 5 MG/5ML IV SOLN: 5.0000 mg | Freq: Four times a day (QID) | INTRAVENOUS | Status: DC | PRN

## 2018-05-29 MED ORDER — METRONIDAZOLE IN NACL 5-0.79 MG/ML-% IV SOLN
500.0000 mg | INTRAVENOUS | Status: AC
  Administered 2018-05-29: 500 mg via INTRAVENOUS

## 2018-05-29 MED ORDER — PROMETHAZINE HCL 25 MG/ML IJ SOLN: 6.2500 mg | INTRAMUSCULAR | Status: DC | PRN

## 2018-05-29 MED ORDER — BUPIVACAINE-EPINEPHRINE (PF) 0.25% -1:200000 IJ SOLN
INTRAMUSCULAR | Status: AC
  Filled 2018-05-29: qty 30

## 2018-05-29 MED ORDER — BUPIVACAINE-EPINEPHRINE (PF) 0.25% -1:200000 IJ SOLN
INTRAMUSCULAR | Status: DC | PRN
  Administered 2018-05-29: 10 mL

## 2018-05-29 MED ORDER — ACETAMINOPHEN 650 MG RE SUPP: 650.0000 mg | Freq: Four times a day (QID) | RECTAL | Status: DC | PRN

## 2018-05-29 MED ORDER — PROPOFOL 10 MG/ML IV BOLUS
INTRAVENOUS | Status: AC
  Filled 2018-05-29: qty 40

## 2018-05-29 MED ORDER — HYDROMORPHONE HCL 1 MG/ML IJ SOLN
INTRAMUSCULAR | Status: AC
  Administered 2018-05-29: 03:00:00
  Filled 2018-05-29: qty 1

## 2018-05-29 MED ORDER — ACETAMINOPHEN 500 MG PO TABS
1000.0000 mg | ORAL_TABLET | ORAL | Status: AC
  Administered 2018-05-29: 1000 mg via ORAL
  Filled 2018-05-29: qty 2

## 2018-05-29 MED ORDER — NICOTINE 14 MG/24HR TD PT24
14.0000 mg | MEDICATED_PATCH | Freq: Every day | TRANSDERMAL | Status: DC
  Administered 2018-05-29 – 2018-05-31 (×3): 14 mg via TRANSDERMAL
  Filled 2018-05-29 (×3): qty 1

## 2018-05-29 MED ORDER — ENOXAPARIN SODIUM 40 MG/0.4ML ~~LOC~~ SOLN
40.0000 mg | SUBCUTANEOUS | Status: DC
  Administered 2018-05-30 – 2018-05-31 (×2): 40 mg via SUBCUTANEOUS
  Filled 2018-05-29 (×2): qty 0.4

## 2018-05-29 MED ORDER — HYDROMORPHONE HCL 1 MG/ML IJ SOLN
INTRAMUSCULAR | Status: AC
  Administered 2018-05-29: 05:00:00
  Filled 2018-05-29: qty 1

## 2018-05-29 MED ORDER — FENTANYL CITRATE (PF) 100 MCG/2ML IJ SOLN
INTRAMUSCULAR | Status: AC
  Filled 2018-05-29: qty 2

## 2018-05-29 MED ORDER — HYDROMORPHONE HCL 1 MG/ML IJ SOLN
0.2500 mg | INTRAMUSCULAR | Status: DC | PRN
  Administered 2018-05-29 (×4): 0.5 mg via INTRAVENOUS

## 2018-05-29 MED ORDER — METRONIDAZOLE IN NACL 5-0.79 MG/ML-% IV SOLN
INTRAVENOUS | Status: AC
  Filled 2018-05-29: qty 100

## 2018-05-29 MED ORDER — LACTATED RINGERS IV BOLUS: 1000.0000 mL | Freq: Three times a day (TID) | INTRAVENOUS | Status: AC | PRN

## 2018-05-29 MED ORDER — CELECOXIB 200 MG PO CAPS
200.0000 mg | ORAL_CAPSULE | ORAL | Status: AC
  Administered 2018-05-29: 200 mg via ORAL
  Filled 2018-05-29 (×2): qty 1

## 2018-05-29 MED ORDER — SODIUM CHLORIDE 0.9 % IV BOLUS
1000.0000 mL | Freq: Once | INTRAVENOUS | Status: AC
  Administered 2018-05-29: 1000 mL via INTRAVENOUS

## 2018-05-29 MED ORDER — HYDROMORPHONE HCL 1 MG/ML IJ SOLN
0.5000 mg | INTRAMUSCULAR | Status: DC | PRN
  Administered 2018-05-29: 2 mg via INTRAVENOUS
  Administered 2018-05-29: 1 mg via INTRAVENOUS
  Administered 2018-05-29 – 2018-05-30 (×3): 2 mg via INTRAVENOUS
  Filled 2018-05-29 (×4): qty 2
  Filled 2018-05-29: qty 1

## 2018-05-29 MED ORDER — DEXAMETHASONE SODIUM PHOSPHATE 10 MG/ML IJ SOLN
INTRAMUSCULAR | Status: DC | PRN
  Administered 2018-05-29: 10 mg via INTRAVENOUS

## 2018-05-29 MED ORDER — CHLORHEXIDINE GLUCONATE CLOTH 2 % EX PADS: 6.0000 | MEDICATED_PAD | Freq: Once | CUTANEOUS | Status: DC

## 2018-05-29 MED ORDER — SODIUM CHLORIDE 0.9 % IV SOLN
INTRAVENOUS | Status: DC | PRN
  Administered 2018-05-29: 1000 mL via INTRAVENOUS

## 2018-05-29 MED ORDER — DIAZEPAM 5 MG PO TABS: 5.0000 mg | ORAL_TABLET | Freq: Four times a day (QID) | ORAL | Status: DC | PRN

## 2018-05-29 MED ORDER — MIDAZOLAM HCL 2 MG/2ML IJ SOLN
INTRAMUSCULAR | Status: DC | PRN
  Administered 2018-05-29: 2 mg via INTRAVENOUS

## 2018-05-29 MED ORDER — PIPERACILLIN-TAZOBACTAM 3.375 G IVPB
3.3750 g | Freq: Three times a day (TID) | INTRAVENOUS | Status: DC
  Administered 2018-05-29 – 2018-05-31 (×6): 3.375 g via INTRAVENOUS
  Filled 2018-05-29 (×6): qty 50

## 2018-05-29 MED ORDER — LACTATED RINGERS IV SOLN
INTRAVENOUS | Status: DC
  Administered 2018-05-30 – 2018-05-31 (×2): via INTRAVENOUS

## 2018-05-29 MED ORDER — LACTATED RINGERS IV SOLN
INTRAVENOUS | Status: DC
  Administered 2018-05-29 (×2): via INTRAVENOUS

## 2018-05-29 MED ORDER — ENALAPRILAT 1.25 MG/ML IV SOLN
0.6250 mg | Freq: Four times a day (QID) | INTRAVENOUS | Status: DC | PRN
  Filled 2018-05-29: qty 1

## 2018-05-29 MED ORDER — PHENYLEPHRINE 40 MCG/ML (10ML) SYRINGE FOR IV PUSH (FOR BLOOD PRESSURE SUPPORT)
PREFILLED_SYRINGE | INTRAVENOUS | Status: DC | PRN
  Administered 2018-05-29 (×3): 80 ug via INTRAVENOUS
  Administered 2018-05-29: 120 ug via INTRAVENOUS
  Administered 2018-05-29 (×4): 80 ug via INTRAVENOUS

## 2018-05-29 MED ORDER — DEXAMETHASONE SODIUM PHOSPHATE 10 MG/ML IJ SOLN
INTRAMUSCULAR | Status: AC
  Filled 2018-05-29: qty 1

## 2018-05-29 MED ORDER — HYDROMORPHONE HCL 1 MG/ML IJ SOLN
1.0000 mg | Freq: Once | INTRAMUSCULAR | Status: AC
  Administered 2018-05-29: 1 mg via INTRAVENOUS
  Filled 2018-05-29: qty 1

## 2018-05-29 MED ORDER — BUPIVACAINE LIPOSOME 1.3 % IJ SUSP
20.0000 mL | Freq: Once | INTRAMUSCULAR | Status: DC
  Filled 2018-05-29: qty 20

## 2018-05-29 MED ORDER — METHOCARBAMOL 1000 MG/10ML IJ SOLN
1000.0000 mg | Freq: Four times a day (QID) | INTRAVENOUS | Status: DC | PRN
  Filled 2018-05-29: qty 10

## 2018-05-29 MED ORDER — ONDANSETRON HCL 4 MG/2ML IJ SOLN
INTRAMUSCULAR | Status: AC
  Filled 2018-05-29: qty 2

## 2018-05-29 MED ORDER — OXYCODONE-ACETAMINOPHEN 5-325 MG PO TABS
1.0000 | ORAL_TABLET | ORAL | Status: DC | PRN
  Administered 2018-05-29 – 2018-05-30 (×4): 2 via ORAL
  Filled 2018-05-29 (×4): qty 2

## 2018-05-29 MED ORDER — LIDOCAINE HCL (CARDIAC) PF 100 MG/5ML IV SOSY
PREFILLED_SYRINGE | INTRAVENOUS | Status: DC | PRN
  Administered 2018-05-29: 100 mg via INTRAVENOUS

## 2018-05-29 MED ORDER — ONDANSETRON 4 MG PO TBDP: 4.0000 mg | ORAL_TABLET | Freq: Four times a day (QID) | ORAL | Status: DC | PRN

## 2018-05-29 MED ORDER — KETOROLAC TROMETHAMINE 30 MG/ML IJ SOLN
30.0000 mg | Freq: Once | INTRAMUSCULAR | Status: AC | PRN
  Administered 2018-05-29: 30 mg via INTRAVENOUS

## 2018-05-29 MED ORDER — ONDANSETRON HCL 4 MG/2ML IJ SOLN
INTRAMUSCULAR | Status: DC | PRN
  Administered 2018-05-29: 4 mg via INTRAVENOUS

## 2018-05-29 MED ORDER — ACETAMINOPHEN 325 MG PO TABS: 650.0000 mg | ORAL_TABLET | Freq: Four times a day (QID) | ORAL | Status: DC | PRN

## 2018-05-29 MED ORDER — OXYCODONE HCL 5 MG PO TABS
5.0000 mg | ORAL_TABLET | ORAL | Status: DC | PRN
  Administered 2018-05-29 – 2018-05-30 (×5): 5 mg via ORAL
  Administered 2018-05-31 (×3): 10 mg via ORAL
  Filled 2018-05-29: qty 2
  Filled 2018-05-29 (×2): qty 1
  Filled 2018-05-29 (×2): qty 2
  Filled 2018-05-29 (×3): qty 1

## 2018-05-29 SURGICAL SUPPLY — 21 items
BLADE SURG 15 STRL LF DISP TIS (BLADE) ×1 IMPLANT
BLADE SURG 15 STRL SS (BLADE) ×2
CLEANER TIP ELECTROSURG 2X2 (MISCELLANEOUS) IMPLANT
COVER WAND RF STERILE (DRAPES) IMPLANT
DRSG PAD ABDOMINAL 8X10 ST (GAUZE/BANDAGES/DRESSINGS) ×3 IMPLANT
ELECT PENCIL ROCKER SW 15FT (MISCELLANEOUS) IMPLANT
ELECT REM PT RETURN 15FT ADLT (MISCELLANEOUS) IMPLANT
GAUZE 4X4 16PLY RFD (DISPOSABLE) ×3 IMPLANT
GAUZE PACKING IODOFORM 1X5 (MISCELLANEOUS) IMPLANT
GAUZE SPONGE 4X4 12PLY STRL (GAUZE/BANDAGES/DRESSINGS) ×3 IMPLANT
GLOVE BIO SURGEON STRL SZ7.5 (GLOVE) ×3 IMPLANT
GOWN STRL REUS W/TWL LRG LVL3 (GOWN DISPOSABLE) ×6 IMPLANT
KIT BASIN OR (CUSTOM PROCEDURE TRAY) ×3 IMPLANT
KIT TURNOVER KIT A (KITS) IMPLANT
NS IRRIG 1000ML POUR BTL (IV SOLUTION) ×3 IMPLANT
PACK LITHOTOMY IV (CUSTOM PROCEDURE TRAY) ×3 IMPLANT
SWAB COLLECTION DEVICE MRSA (MISCELLANEOUS) ×3 IMPLANT
SWAB CULTURE ESWAB REG 1ML (MISCELLANEOUS) ×3 IMPLANT
TOWEL OR 17X26 10 PK STRL BLUE (TOWEL DISPOSABLE) ×3 IMPLANT
UNDERPAD 30X30 (UNDERPADS AND DIAPERS) ×3 IMPLANT
YANKAUER SUCT BULB TIP NO VENT (SUCTIONS) ×3 IMPLANT

## 2018-05-29 NOTE — Anesthesia Procedure Notes (Signed)
Procedure Name: LMA Insertion Date/Time: 05/29/2018 12:44 PM Performed by: Raenette Rover, CRNA Pre-anesthesia Checklist: Patient identified, Emergency Drugs available, Suction available and Patient being monitored Patient Re-evaluated:Patient Re-evaluated prior to induction Oxygen Delivery Method: Circle system utilized Preoxygenation: Pre-oxygenation with 100% oxygen Induction Type: IV induction Ventilation: Mask ventilation without difficulty LMA: LMA inserted LMA Size: 5.0 Number of attempts: 1 Placement Confirmation: positive ETCO2,  breath sounds checked- equal and bilateral and CO2 detector Tube secured with: Tape Dental Injury: Teeth and Oropharynx as per pre-operative assessment

## 2018-05-29 NOTE — Progress Notes (Signed)
Pharmacy Antibiotic Note  Carlos Daugherty is a 48 y.o. male admitted on 05/28/2018 with empiric/surgical/abscess.  Pharmacy has been consulted for zosyn dosing.  Plan: Zosyn 3.375g IV q8h (4 hour infusion).  Height: 6\' 4"  (193 cm) Weight: 201 lb (91.2 kg) IBW/kg (Calculated) : 86.8  Temp (24hrs), Avg:98.6 F (37 C), Min:98.2 F (36.8 C), Max:99 F (37.2 C)  Recent Labs  Lab 05/28/18 2152  WBC 13.5*  CREATININE 0.86    Estimated Creatinine Clearance: 129 mL/min (by C-G formula based on SCr of 0.86 mg/dL).    No Known Allergies  Antimicrobials this admission: Zosyn 05/29/2018 >>  Ancef 05/29/2018 on call Flagyl 05/29/2018 on call Cefoxitin 05/29/2018 x1   Dose adjustments this admission: -  Microbiology results: -  Thank you for allowing pharmacy to be a part of this patient's care.  Nani Skillern Crowford 05/29/2018 5:26 AM

## 2018-05-29 NOTE — Op Note (Signed)
05/28/2018 - 05/29/2018  1:36 PM  PATIENT:  Carlos Daugherty  48 y.o. male  PRE-OPERATIVE DIAGNOSIS:  Peri-rectal abcess  POST-OPERATIVE DIAGNOSIS: perirectal abscess  PROCEDURE:  Procedure(s): IRRIGATION AND DEBRIDEMENT PERIRECTAL ABSCESS (N/A)  SURGEON:  Surgeon(s) and Role:    * Jovita Kussmaul, MD - Primary  PHYSICIAN ASSISTANT:   ASSISTANTS: none   ANESTHESIA:   local and general  EBL:  25 mL   BLOOD ADMINISTERED:none  DRAINS: Penrose drain in the perirectal space   LOCAL MEDICATIONS USED:  MARCAINE     SPECIMEN:  Source of Specimen:  wall of perirectal abscess  DISPOSITION OF SPECIMEN:  PATHOLOGY  COUNTS:  YES  TOURNIQUET:  * No tourniquets in log *  DICTATION: .Dragon Dictation   After informed consent was obtained the patient was brought to the operating room and placed in the supine position on the operating table.  After adequate induction of general anesthesia the patient was placed in lithotomy position on the operating room table and all pressure points were padded.  The perirectal area was then prepped with Betadine and draped in usual sterile manner.  The right perirectal space was infiltrated with quarter percent Marcaine.  A bullet type retractor was then placed in the rectum and there were 2 areas, 1 fairly superficial and one much deeper where there was obvious pus draining through the wall of the posterior rectum.  The deep opening was quite small.  The superficial opening was quite large.  A small incision was made in the right perirectal space with a 15 blade knife.  The incision was carried through the skin and subcutaneous tissue sharply with electrocautery.  By blunt finger dissection the space then opened into the abscess cavity.  1/4 inch Penrose drain was placed through this external opening into the abscess cavity and then out the anus through the large opening already created by the abscess cavity.  The 2 ends of the Penrose drain were connected with a  3-0 nylon stitch.  There was some firm nodular tissue in the cavity opening within the rectum.  Some of this tissue was biopsied with Metzenbaum scissors and sent to pathology for further evaluation.  The external and internal wounds were then packed with 4 x 4 gauze and sterile dressings were applied.  The patient tolerated the procedure well.  At the end of the case all needle sponge and instrument counts were correct.  The patient was then awakened and taken recovery in stable condition.  PLAN OF CARE: Admit to inpatient   PATIENT DISPOSITION:  PACU - hemodynamically stable.   Delay start of Pharmacological VTE agent (>24hrs) due to surgical blood loss or risk of bleeding: yes

## 2018-05-29 NOTE — Anesthesia Preprocedure Evaluation (Signed)
Anesthesia Evaluation  Patient identified by MRN, date of birth, ID band Patient awake    Reviewed: Allergy & Precautions, NPO status , Patient's Chart, lab work & pertinent test results  Airway Mallampati: II  TM Distance: >3 FB Neck ROM: Full    Dental no notable dental hx.    Pulmonary Current Smoker,    Pulmonary exam normal breath sounds clear to auscultation       Cardiovascular hypertension, Normal cardiovascular exam Rhythm:Regular Rate:Normal     Neuro/Psych negative neurological ROS  negative psych ROS   GI/Hepatic Neg liver ROS, GERD  ,  Endo/Other  negative endocrine ROS  Renal/GU negative Renal ROS  negative genitourinary   Musculoskeletal negative musculoskeletal ROS (+)   Abdominal   Peds negative pediatric ROS (+)  Hematology negative hematology ROS (+)   Anesthesia Other Findings   Reproductive/Obstetrics negative OB ROS                             Anesthesia Physical Anesthesia Plan  ASA: II  Anesthesia Plan: General   Post-op Pain Management:    Induction: Intravenous  PONV Risk Score and Plan: 1 and Ondansetron, Treatment may vary due to age or medical condition and Dexamethasone  Airway Management Planned: LMA and Oral ETT  Additional Equipment:   Intra-op Plan:   Post-operative Plan: Extubation in OR  Informed Consent: I have reviewed the patients History and Physical, chart, labs and discussed the procedure including the risks, benefits and alternatives for the proposed anesthesia with the patient or authorized representative who has indicated his/her understanding and acceptance.     Dental advisory given  Plan Discussed with: CRNA and Surgeon  Anesthesia Plan Comments:         Anesthesia Quick Evaluation

## 2018-05-29 NOTE — Transfer of Care (Signed)
Immediate Anesthesia Transfer of Care Note  Patient: Carlos Daugherty  Procedure(s) Performed: IRRIGATION AND DEBRIDEMENT PERIRECTAL ABSCESS (N/A )  Patient Location: PACU  Anesthesia Type:General  Level of Consciousness: awake, alert , oriented and patient cooperative  Airway & Oxygen Therapy: Patient Spontanous Breathing and Patient connected to face mask oxygen  Post-op Assessment: Report given to RN and Post -op Vital signs reviewed and stable  Post vital signs: Reviewed and stable  Last Vitals:  Vitals Value Taken Time  BP 122/87 05/29/2018  1:46 PM  Temp    Pulse 87 05/29/2018  1:49 PM  Resp 18 05/29/2018  1:49 PM  SpO2 100 % 05/29/2018  1:49 PM  Vitals shown include unvalidated device data.  Last Pain:  Vitals:   05/29/18 1037  TempSrc:   PainSc: 0-No pain      Patients Stated Pain Goal: 3 (81/85/90 9311)  Complications: No apparent anesthesia complications

## 2018-05-29 NOTE — Anesthesia Postprocedure Evaluation (Signed)
Anesthesia Post Note  Patient: Carlos Daugherty  Procedure(s) Performed: IRRIGATION AND DEBRIDEMENT PERIRECTAL ABSCESS (N/A )     Patient location during evaluation: PACU Anesthesia Type: General Level of consciousness: awake and alert Pain management: pain level controlled Vital Signs Assessment: post-procedure vital signs reviewed and stable Respiratory status: spontaneous breathing, nonlabored ventilation, respiratory function stable and patient connected to nasal cannula oxygen Cardiovascular status: blood pressure returned to baseline and stable Postop Assessment: no apparent nausea or vomiting Anesthetic complications: no    Last Vitals:  Vitals:   05/29/18 0746 05/29/18 1346  BP: 96/64 122/87  Pulse: 93 76  Resp: 20 15  Temp: 37.6 C (!) 36.4 C  SpO2: 96% 100%    Last Pain:  Vitals:   05/29/18 1346  TempSrc:   PainSc: 6                  Kyndal Gloster S

## 2018-05-29 NOTE — ED Notes (Signed)
ED TO INPATIENT HANDOFF REPORT  ED Nurse Name and Phone #: Clarise Cruz [phone]  S Name/Age/Gender Carlos Daugherty 48 y.o. male Room/Bed: WA20/WA20  Code Status   Code Status: Full Code  Home/SNF/Other Home Patient oriented to: self Is this baseline? Yes   Triage Complete: Triage complete  Chief Complaint abdominal pain and urinary retention  Triage Note Pt c/o "hemorrhoid" pain x 5 weeks-states he was seen by PCP with general surgery referral-pt states he has not heard from surgery yet-entered triage grimacing    Allergies No Known Allergies  Level of Care/Admitting Diagnosis ED Disposition    ED Disposition Condition Comment   [facility]: [place] [[id number]]  Level of Care: Med-Surg [16]  Diagnosis: Horseshoe abscess of ischiorectal space [[id number]]  Admitting Physician: [place], [place]  Attending Physician: CCS, MD [[id number]]  Estimated length of stay: past midnight tomorrow  Certification:: I certify this patient will need inpatient services for at least 2 midnights  PT Class (Do Not Modify): Inpatient [101]  PT Acc Code (Do Not Modify): Private [1]       B Medical/Surgery History Past Medical History:  Diagnosis Date  . Arthritis   . GERD (gastroesophageal reflux disease)    Past Surgical History:  Procedure Laterality Date  . HERNIA REPAIR     X 2  . MAXIMUM ACCESS (MAS)POSTERIOR LUMBAR INTERBODY FUSION (PLIF) 1 LEVEL N/A 09/27/2014   Procedure:  L4-L5 mas PLIF  ;  Surgeon: Earnie Larsson, MD;  Location: [facility];  Service: Neurosurgery;  Laterality: N/A;   L4-L5 mas PLIF    . NASAL SINUS SURGERY    . UPPER GASTROINTESTINAL ENDOSCOPY     looking d/t acid reflux     A IV Location/Drains/Wounds Patient Lines/Drains/Airways Status   Active Line/Drains/Airways    Name:   Placement date:   Placement time:   Site:   Days:   Peripheral IV 05/28/18 Left;Anterior Forearm   05/28/18    2152    Forearm   1   Incision (Closed) 09/27/14 Back    09/27/14    1605     1340          Intake/Output Last 24 hours  Intake/Output Summary (Last 24 hours) at 05/29/2018 8242 Last data filed at 05/29/2018 0102 Gross per 24 hour  Intake 894.84 ml  Output 500 ml  Net 394.84 ml    Labs/Imaging Results for orders placed or performed during the hospital encounter of 05/28/18 (from the past 48 hour(s))  Urinalysis, Routine w reflex microscopic     Status: Abnormal   Collection Time: 05/28/18  9:38 PM  Result Value Ref Range   Color, Urine YELLOW YELLOW   APPearance CLEAR CLEAR   Specific Gravity, Urine <1.005 (L) 1.005 - 1.030   pH 6.0 5.0 - 8.0   Glucose, UA NEGATIVE NEGATIVE mg/dL   Hgb urine dipstick NEGATIVE NEGATIVE   Bilirubin Urine NEGATIVE NEGATIVE   Ketones, ur NEGATIVE NEGATIVE mg/dL   Protein, ur NEGATIVE NEGATIVE mg/dL   Nitrite NEGATIVE NEGATIVE   Leukocytes,Ua NEGATIVE NEGATIVE    Comment: Microscopic not done on urines with negative protein, blood, leukocytes, nitrite, or glucose < 500 mg/dL. Performed at [facility], [address] [place], [place] [address]   CBC     Status: Abnormal   Collection Time: 05/28/18  9:52 PM  Result Value Ref Range   WBC 13.5 (H) 4.0 - 10.5 K/uL   RBC 4.84 4.22 -  5.81 MIL/uL   Hemoglobin 11.0 (L) 13.0 - 17.0 g/dL   HCT 38.1 (L) 39.0 - 52.0 %   MCV 78.7 (L) 80.0 - 100.0 fL   MCH 22.7 (L) 26.0 - 34.0 pg   MCHC 28.9 (L) 30.0 - 36.0 g/dL   RDW 17.7 (H) 11.5 - 15.5 %   Platelets 299 150 - 400 K/uL   nRBC 0.0 0.0 - 0.2 %    Comment: Performed at [facility], [address] [place], [place] [address]  Basic metabolic panel     Status: Abnormal   Collection Time: 05/28/18  9:52 PM  Result Value Ref Range   Sodium 134 (L) 135 - 145 mmol/L   Potassium 3.4 (L) 3.5 - 5.1 mmol/L   Chloride 104 98 - 111 mmol/L   CO2 19 (L) 22 - 32 mmol/L   Glucose, Bld 73 70 - 99 mg/dL   BUN 12 6 - 20 mg/dL   Creatinine, Ser 0.86 0.61 - 1.24 mg/dL   Calcium 9.7 8.9 - 10.3 mg/dL    GFR calc non Af Amer >60 >60 mL/min   GFR calc Af Amer >60 >60 mL/min   Anion gap 11 5 - 15    Comment: Performed at [facility], [address] [place], [place] [address]   Ct Abdomen Pelvis W Contrast  Result Date: 05/28/2018 CLINICAL DATA:  48 year old male with perianal abscess, recent fever. EXAM: CT ABDOMEN AND PELVIS WITH CONTRAST TECHNIQUE: Multidetector CT imaging of the abdomen and pelvis was performed using the standard protocol following bolus administration of intravenous contrast. CONTRAST:  146mL OMNIPAQUE IOHEXOL 300 MG/ML  SOLN COMPARISON:  Lumbar MRI 10/15/2015. FINDINGS: Lower chest: 5 millimeter right lower lobe lateral basal segment subpleural lung nodule which is likely benign. Tiny nodular density along the left major fissure on series 3, image 6 is probably a benign intrapleural lymph node. Negative lung bases otherwise. No pericardial or pleural effusion. Hepatobiliary: Negative liver and gallbladder. Pancreas: Negative. Spleen: Negative. Adrenals/Urinary Tract: Normal adrenal glands. Bilateral renal enhancement and contrast excretion is symmetric and normal. Negative ureters. Unremarkable urinary bladder. Stomach/Bowel: There is a complex multilocular perianal abscess. The largest component is to the right of midline, tracks cephalad, contains an air-fluid level, and encompasses 60 x 32 x 57 millimeters (estimated volume 54 milliliters). A smaller posterior and left of midline component is about 4 centimeters. The 2 components appear to meet posteriorly (7 o'clock position. Both components are demonstrated on series 2, image 107. On coronal image 73 there is inflammation surrounding the larger collection which is contiguous with the pelvic floor. However, there is no pelvic free fluid or other complicating feature. Proximal to the anal verge the rectum appears normal. Negative sigmoid colon. Other large bowel segments are negative aside from retained stool. Normal  appendix. Oral contrast has not yet reached the distal small bowel. The TI is normal. No dilated small bowel loops. Negative stomach and duodenum. No free air or free fluid. Vascular/Lymphatic: Mild Aortoiliac calcified atherosclerosis. Major arterial structures are patent. Portal venous system is patent. No lymphadenopathy, but there is a mildly reactive appearing right external iliac lymph node which is asymmetric measuring 13 millimeters on series 2, image 94. Reproductive: Negative. Other: None. Musculoskeletal: Previous lumbar fusion. No acute osseous abnormality identified. IMPRESSION: 1. Complex and large multiloculated perianal abscess. The largest component is on the right of midline, tracks toward the pelvic floor, and has an estimated volume of 54 mL. No  pelvic free fluid or other complicating features. This was discussed by telephone with Dr. Dorie Rank on 05/28/2018 at 23:54 . 2. Subpleural 5 mm right lower lobe nodule which is likely benign. No follow-up needed if patient is low-risk. Non-contrast chest CT can be considered in 12 months if patient is high-risk. This recommendation follows the consensus statement: Guidelines for Management of Incidental Pulmonary Nodules Detected on CT Images: From the Fleischner Society 2017; Radiology 2017; 284:228-243. Electronically Signed   By: Genevie Ann M.D.   On: 05/28/2018 23:58    Pending Labs Unresulted Labs (From admission, onward)    Start     Ordered   06/05/18 0500  Creatinine, serum  (enoxaparin (LOVENOX)    CrCl >/= 30 ml/min)  Weekly,   R    Comments:  while on enoxaparin therapy    05/29/18 0528   05/29/18 0527  HIV antibody (Routine Testing)  Once,   R     05/29/18 0528          Vitals/Pain Today's Vitals   05/29/18 0300 05/29/18 0400 05/29/18 0549 05/29/18 0600  BP: 125/74 122/80 121/83 124/82  Pulse: 95 95 92 93  Resp: 17 19 18 19   Temp:      TempSrc:      SpO2: 95% 97% 95% 91%  Weight:      Height:      PainSc:         Isolation Precautions No active isolations  Medications Medications  sodium chloride 0.9 % bolus 1,000 mL (0 mLs Intravenous Stopped 05/28/18 2329)    Followed by  0.9 %  sodium chloride infusion (1,000 mLs Intravenous Not Given 05/29/18 0108)  0.9 %  sodium chloride infusion ( Intravenous Stopped 05/29/18 0506)  Chlorhexidine Gluconate Cloth 2 % PADS 6 each (has no administration in time range)    And  Chlorhexidine Gluconate Cloth 2 % PADS 6 each (has no administration in time range)  acetaminophen (TYLENOL) tablet 1,000 mg (has no administration in time range)  bupivacaine liposome (EXPAREL) 1.3 % injection 266 mg (has no administration in time range)  gabapentin (NEURONTIN) capsule 300 mg (has no administration in time range)  ceFAZolin (ANCEF) IVPB 2g/100 mL premix (has no administration in time range)    And  metroNIDAZOLE (FLAGYL) IVPB 500 mg (has no administration in time range)  celecoxib (CELEBREX) capsule 200 mg (has no administration in time range)  lactated ringers bolus 1,000 mL (has no administration in time range)  HYDROmorphone (DILAUDID) injection 0.5-2 mg (1 mg Intravenous Given 05/29/18 0553)  methocarbamol (ROBAXIN) 1,000 mg in dextrose 5 % 50 mL IVPB (has no administration in time range)  oxyCODONE (Oxy IR/ROXICODONE) immediate release tablet 5-10 mg (has no administration in time range)  diazepam (VALIUM) tablet 5-10 mg (has no administration in time range)  pantoprazole (PROTONIX) EC tablet 40 mg (has no administration in time range)  piperacillin-tazobactam (ZOSYN) IVPB 3.375 g (has no administration in time range)  enoxaparin (LOVENOX) injection 40 mg (has no administration in time range)  lactated ringers infusion (has no administration in time range)  acetaminophen (TYLENOL) tablet 650 mg (has no administration in time range)    Or  acetaminophen (TYLENOL) suppository 650 mg (has no administration in time range)  ondansetron (ZOFRAN-ODT) disintegrating  tablet 4 mg (has no administration in time range)    Or  ondansetron (ZOFRAN) injection 4 mg (has no administration in time range)  simethicone (MYLICON) chewable tablet 40 mg (has no administration in time  range)  gabapentin (NEURONTIN) capsule 300 mg (has no administration in time range)  lip balm (CARMEX) ointment 1 application (has no administration in time range)  magic mouthwash (has no administration in time range)  polyethylene glycol (MIRALAX / GLYCOLAX) packet 17 g (has no administration in time range)  metoprolol tartrate (LOPRESSOR) tablet 12.5 mg (has no administration in time range)  metoprolol tartrate (LOPRESSOR) injection 5 mg (has no administration in time range)  enalaprilat (VASOTEC) injection 0.625-1.25 mg (has no administration in time range)  nicotine (NICODERM CQ - dosed in mg/24 hours) patch 14 mg (has no administration in time range)  ondansetron (ZOFRAN) injection 4 mg (4 mg Intravenous Given 05/28/18 2201)  morphine 4 MG/ML injection 4 mg (4 mg Intravenous Given 05/28/18 2204)  iohexol (OMNIPAQUE) 300 MG/ML solution 100 mL (100 mLs Intravenous Contrast Given 05/28/18 2307)  morphine 4 MG/ML injection 4 mg (4 mg Intravenous Given 05/28/18 2357)  cefOXitin (MEFOXIN) 2 g in sodium chloride 0.9 % 100 mL IVPB (0 g Intravenous Stopped 05/29/18 0040)  sodium chloride 0.9 % bolus 1,000 mL (0 mLs Intravenous Stopped 05/29/18 0108)  HYDROmorphone (DILAUDID) injection 1 mg (1 mg Intravenous Given 05/29/18 0026)  HYDROmorphone (DILAUDID) 1 MG/ML injection (  Given 05/29/18 0500)  HYDROmorphone (DILAUDID) 1 MG/ML injection (  Given 05/29/18 0240)  lactated ringers bolus 1,000 mL (1,000 mLs Intravenous New Bag/Given 05/29/18 0554)    Mobility walks Low fall risk   Focused Assessments    R Recommendations: See Admitting Provider Note  Report given to:   Additional Notes: None

## 2018-05-29 NOTE — ED Notes (Addendum)
Spoke with Carlos Daugherty on 5W. No nurses available to accept patient. Patient will have to be transported after shift change and report called at 0700.

## 2018-05-29 NOTE — H&P (Signed)
Carlos Daugherty  1970-11-11 [id number]  CARE TEAM:  PCP: Maurice Small, MD  Outpatient Care Team: Patient Care Team: Maurice Small, MD as PCP - General (Family Medicine)  Inpatient Treatment Team: Treatment Team: Technician: Cephas Darby, [place]; Consulting Physician: Edison Pace, Md, MD; Registered Nurse: Marlene Bast, RN   This patient is a 48 y.o.male who presents today for surgical evaluation at the request of Dr . Dorie Rank  Chief complaint / Reason for evaluation: Large perirectal abscess.  Smoking male with hypertension and GERD but otherwise rather healthy.  History of hemorrhoid problems for over a decade.  Had some pain and discomfort a few weeks ago.  Thought it was another hemorrhoid flare.  Did not consistently improve.  Became more painful.  Much more intense over the past 3 days.  Obvious swelling.  Pain became unbearable.  Went to the emergency room at [facility].  No thrombosed hemorrhoid.  Vague pain on the right side.  CT scan done shows abscess primarily on the right side but starting to come over to the left as well.  Surgical consultation requested.  Patient transferred to [facility] where surgical care can be provided.  No personal nor family history of GI/colon cancer, inflammatory bowel disease, irritable bowel syndrome, allergy such as Celiac Sprue, dietary/dairy problems, colitis, ulcers nor gastritis.  No recent sick contacts/gastroenteritis.  No travel outside the country.  No changes in diet.  No dysphagia to solids or liquids.  No significant heartburn or reflux.  No hematochezia, hematemesis, coffee ground emesis.  No evidence of prior gastric/peptic ulceration.  He moves his bowels every day.  No recent bout of constipation or diarrhea.  He smokes a pack of cigarettes a day.  Had a hemorrhoid flare a decade ago.  Does not recall any other abscesses.    Assessment  Carlos Daugherty  48 y.o. male       Problem List:  Active Problems:   * No  active hospital problems. *   Perirectal abscess with obvious swelling and phlegmon in the right perirectal circumference.  CT scan reveals pus and gas going high in the ischio rectal space and carrying over to the left side as well concerning for horseshoe type abscess.  Plan:  Admit.  IV antibiotics.  Examination under anesthesia with probable incision and drainage.  May require counterincisions on both sides.  Has gas and pus in the ischio rectal space, concerning for Fournier's gangrene but no hard evidence at least superficially at this time.  The anatomy and physiology of the region was discussed. The pathophysiology of subcutaneous abscess formation with progression to fasciitis & sepsis was discussed.  Need for incision, drainage, debridement discussed.  I stressed good hygiene & need for repeated wound care.  Possible redebridement / reoperation was discussed as well. Possibility of recurrence was discussed.   Risks of bleeding, infection, abscess, leak, injury to other organs, need for repair of tissues / organs, need for further treatment, heart attack, death, and other risks were discussed.  Benefits, alternatives were discussed. I noted a good likelihood this will help address the problem.  Questions answered.  The patient agrees to proceed.   Improve pain control.  Quit smoking.  STOP SMOKING! We talked to the patient about the dangers of smoking.  We stressed that tobacco use dramatically increases the risk of peri-operative complications such as infection, tissue necrosis leaving to problems with incision/wound and organ healing, hernia, chronic pain, heart attack,  stroke, DVT, pulmonary embolism, and death.  We noted there are programs in our community to help stop smoking.  Information was available.  Hypertension control.  Hold amlodipine for now and use metoprolol as needed.  Restart once incision and drainage done.  GERD control with PPI.  -VTE prophylaxis- SCDs, etc  -mobilize as tolerated to help recovery  40 minutes spent in review, evaluation, examination, counseling, and coordination of care.  More than 50% of that time was spent in counseling.  Adin Hector, MD, FACS, MASCRS Gastrointestinal and Minimally Invasive Surgery    [address] [place], [place] [address] [phone] Main / Paging [phone] Fax   05/29/2018      Past Medical History:  Diagnosis Date  . Arthritis   . GERD (gastroesophageal reflux disease)     Past Surgical History:  Procedure Laterality Date  . HERNIA REPAIR     X 2  . MAXIMUM ACCESS (MAS)POSTERIOR LUMBAR INTERBODY FUSION (PLIF) 1 LEVEL N/A 09/27/2014   Procedure:  L4-L5 mas PLIF  ;  Surgeon: Earnie Larsson, MD;  Location: [facility];  Service: Neurosurgery;  Laterality: N/A;   L4-L5 mas PLIF    . NASAL SINUS SURGERY    . UPPER GASTROINTESTINAL ENDOSCOPY     looking d/t acid reflux    Social History   Socioeconomic History  . Marital status: Married    Spouse name: Not on file  . Number of children: Not on file  . Years of education: Not on file  . Highest education level: Not on file  Occupational History  . Not on file  Social Needs  . Financial resource strain: Not on file  . Food insecurity:    Worry: Not on file    Inability: Not on file  . Transportation needs:    Medical: Not on file    Non-medical: Not on file  Tobacco Use  . Smoking status: Current Every Day Smoker    Years: 1.00    Types: Cigarettes  . Smokeless tobacco: Never Used  Substance and Sexual Activity  . Alcohol use: Yes    Comment: weekly  . Drug use: No  . Sexual activity: Not on file  Lifestyle  . Physical activity:    Days per week: Not on file    Minutes per session: Not on file  . Stress: Not on file  Relationships  . Social connections:    Talks on phone: Not on file    Gets together: Not on file    Attends religious service: Not on file    Active member of club or  organization: Not on file    Attends meetings of clubs or organizations: Not on file    Relationship status: Not on file  . Intimate partner violence:    Fear of current or ex partner: Not on file    Emotionally abused: Not on file    Physically abused: Not on file    Forced sexual activity: Not on file  Other Topics Concern  . Not on file  Social History Narrative  . Not on file    No family history on file.  Current Facility-Administered Medications  Medication Dose Route Frequency Provider Last Rate Last Dose  . 0.9 %  sodium chloride infusion  1,000 mL Intravenous Continuous Dorie Rank, MD      . 0.9 %  sodium chloride infusion   Intravenous PRN Dorie Rank, MD 10 mL/hr at 05/29/18 0011 1,000 mL  at 05/29/18 0011  . HYDROmorphone (DILAUDID) 1 MG/ML injection           . HYDROmorphone (DILAUDID) 1 MG/ML injection            Current Outpatient Medications  Medication Sig Dispense Refill  . amLODipine (NORVASC) 5 MG tablet Take 5 mg by mouth daily.    Marland Kitchen esomeprazole (NEXIUM) 20 MG capsule Take 40 mg by mouth daily at 12 noon.    Marland Kitchen ibuprofen (ADVIL,MOTRIN) 200 MG tablet Take 600 mg by mouth 2 (two) times daily as needed (pain).    Marland Kitchen PROCTOFOAM HC rectal foam Place 1 Pump rectally 3 (three) times daily.    . diazepam (VALIUM) 5 MG tablet Take 1-2 tablets (5-10 mg total) by mouth every 6 (six) hours as needed for muscle spasms. (Patient not taking: Reported on 05/29/2018) 60 tablet 0  . Iron-FA-B Cmp-C-Biot-Probiotic (FUSION PLUS) CAPS TAKE 1 CAPSULE BY MOUTH TWICE DAILY (Patient not taking: Reported on 05/29/2018) 60 capsule 0  . oxyCODONE-acetaminophen (PERCOCET/ROXICET) 5-325 MG per tablet Take 1-2 tablets by mouth every 4 (four) hours as needed for moderate pain. (Patient not taking: Reported on 05/29/2018) 80 tablet 0     No Known Allergies  ROS:   All other systems reviewed & are negative except per HPI or as noted below: Constitutional:  No fevers, chills, sweats.  Weight stable  Eyes:  No vision changes, No discharge HENT:  No sore throats, nasal drainage Lymph: No neck swelling, No bruising easily Pulmonary:  No cough, productive sputum CV: No orthopnea, PND  Patient walks 30 minutes for about 2 miles without difficulty.  No exertional chest/neck/shoulder/arm pain. GI: No personal nor family history of GI/colon cancer, inflammatory bowel disease, irritable bowel syndrome, allergy such as Celiac Sprue, dietary/dairy problems, colitis, ulcers nor gastritis.  No recent sick contacts/gastroenteritis.  No travel outside the country.  No changes in diet. Renal: No UTIs, No hematuria Genital:  No drainage, bleeding, masses Musculoskeletal: No severe joint pain.  Good ROM major joints Skin:  No sores or lesions.  No rashes Heme/Lymph:  No easy bleeding.  No swollen lymph nodes Neuro: No focal weakness/numbness.  No seizures Psych: No suicidal ideation.  No hallucinations  BP 123/81 (BP Location: Right Arm)   Pulse (!) 106   Temp 99 F (37.2 C) (Oral)   Resp (!) 22   Ht 6\' 4"  (1.93 m)   Wt 91.2 kg   SpO2 100%   BMI 24.47 kg/m   Physical Exam: General: Pt awake/alert/oriented x4 in moderate major acute distress.  In obvious pain Eyes: PERRL, normal EOM. Sclera nonicteric Neuro: CN II-XII intact w/o focal sensory/motor deficits. Lymph: No head/neck/groin lymphadenopathy Psych:  No delerium/psychosis/paranoia HENT: Normocephalic, Mucus membranes moist.  No thrush Neck: Supple, No tracheal deviation Chest: No pain.  Good respiratory excursion. CV:  Pulses intact.  Regular rhythm   mild tachycardia Abdomen: Soft, Nondistended.  Nontender.  No incarcerated hernias. Gen:  No inguinal hernias.  No inguinal lymphadenopathy.    Rectal:  Obvious swelling in right lateral perirectal region, evolving at least a third of the circumference.  Vague with no definite blistering or skin necrosis.  No crepitance.  No purulent drainage.  Some discomfort near her coccyx but the  left side soft and nontender.  No obvious thrombosed hemorrhoid.  Patient very sensitive, so held off on internal exam  Ext:  SCDs BLE.  No significant edema.  No cyanosis Skin: No petechiae / purpurea.  No major sores  Musculoskeletal: No severe joint pain.  Good ROM major joints   Results:   Labs: Results for orders placed or performed during the hospital encounter of 05/28/18 (from the past 48 hour(s))  Urinalysis, Routine w reflex microscopic     Status: Abnormal   Collection Time: 05/28/18  9:38 PM  Result Value Ref Range   Color, Urine YELLOW YELLOW   APPearance CLEAR CLEAR   Specific Gravity, Urine <1.005 (L) 1.005 - 1.030   pH 6.0 5.0 - 8.0   Glucose, UA NEGATIVE NEGATIVE mg/dL   Hgb urine dipstick NEGATIVE NEGATIVE   Bilirubin Urine NEGATIVE NEGATIVE   Ketones, ur NEGATIVE NEGATIVE mg/dL   Protein, ur NEGATIVE NEGATIVE mg/dL   Nitrite NEGATIVE NEGATIVE   Leukocytes,Ua NEGATIVE NEGATIVE    Comment: Microscopic not done on urines with negative protein, blood, leukocytes, nitrite, or glucose < 500 mg/dL. Performed at [facility], [place], [address] [place], [place] [address]   CBC     Status: Abnormal   Collection Time: 05/28/18  9:52 PM  Result Value Ref Range   WBC 13.5 (H) 4.0 - 10.5 K/uL   RBC 4.84 4.22 - 5.81 MIL/uL   Hemoglobin 11.0 (L) 13.0 - 17.0 g/dL   HCT 38.1 (L) 39.0 - 52.0 %   MCV 78.7 (L) 80.0 - 100.0 fL   MCH 22.7 (L) 26.0 - 34.0 pg   MCHC 28.9 (L) 30.0 - 36.0 g/dL   RDW 17.7 (H) 11.5 - 15.5 %   Platelets 299 150 - 400 K/uL   nRBC 0.0 0.0 - 0.2 %    Comment: Performed at [facility], [address] [place], [place] [address]  Basic metabolic panel     Status: Abnormal   Collection Time: 05/28/18  9:52 PM  Result Value Ref Range   Sodium 134 (L) 135 - 145 mmol/L   Potassium 3.4 (L) 3.5 - 5.1 mmol/L   Chloride 104 98 - 111 mmol/L   CO2 19 (L) 22 - 32 mmol/L   Glucose, Bld 73 70 - 99 mg/dL   BUN 12 6 - 20 mg/dL   Creatinine,  Ser 0.86 0.61 - 1.24 mg/dL   Calcium 9.7 8.9 - 10.3 mg/dL   GFR calc non Af Amer >60 >60 mL/min   GFR calc Af Amer >60 >60 mL/min   Anion gap 11 5 - 15    Comment: Performed at [facility], [address] [place], [place] [address]    Imaging / Studies: Ct Abdomen Pelvis W Contrast  Result Date: 05/28/2018 CLINICAL DATA:  48 year old male with perianal abscess, recent fever. EXAM: CT ABDOMEN AND PELVIS WITH CONTRAST TECHNIQUE: Multidetector CT imaging of the abdomen and pelvis was performed using the standard protocol following bolus administration of intravenous contrast. CONTRAST:  170mL OMNIPAQUE IOHEXOL 300 MG/ML  SOLN COMPARISON:  Lumbar MRI 10/15/2015. FINDINGS: Lower chest: 5 millimeter right lower lobe lateral basal segment subpleural lung nodule which is likely benign. Tiny nodular density along the left major fissure on series 3, image 6 is probably a benign intrapleural lymph node. Negative lung bases otherwise. No pericardial or pleural effusion. Hepatobiliary: Negative liver and gallbladder. Pancreas: Negative. Spleen: Negative. Adrenals/Urinary Tract: Normal adrenal glands. Bilateral renal enhancement and contrast excretion is symmetric and normal. Negative ureters. Unremarkable urinary bladder. Stomach/Bowel: There is a complex multilocular perianal abscess. The largest component is to the right of midline, tracks cephalad, contains an air-fluid level, and encompasses 60 x 32  x 57 millimeters (estimated volume 54 milliliters). A smaller posterior and left of midline component is about 4 centimeters. The 2 components appear to meet posteriorly (7 o'clock position. Both components are demonstrated on series 2, image 107. On coronal image 73 there is inflammation surrounding the larger collection which is contiguous with the pelvic floor. However, there is no pelvic free fluid or other complicating feature. Proximal to the anal verge the rectum appears normal. Negative sigmoid  colon. Other large bowel segments are negative aside from retained stool. Normal appendix. Oral contrast has not yet reached the distal small bowel. The TI is normal. No dilated small bowel loops. Negative stomach and duodenum. No free air or free fluid. Vascular/Lymphatic: Mild Aortoiliac calcified atherosclerosis. Major arterial structures are patent. Portal venous system is patent. No lymphadenopathy, but there is a mildly reactive appearing right external iliac lymph node which is asymmetric measuring 13 millimeters on series 2, image 94. Reproductive: Negative. Other: None. Musculoskeletal: Previous lumbar fusion. No acute osseous abnormality identified. IMPRESSION: 1. Complex and large multiloculated perianal abscess. The largest component is on the right of midline, tracks toward the pelvic floor, and has an estimated volume of 54 mL. No pelvic free fluid or other complicating features. This was discussed by telephone with Dr. Dorie Rank on 05/28/2018 at 23:54 . 2. Subpleural 5 mm right lower lobe nodule which is likely benign. No follow-up needed if patient is low-risk. Non-contrast chest CT can be considered in 12 months if patient is high-risk. This recommendation follows the consensus statement: Guidelines for Management of Incidental Pulmonary Nodules Detected on CT Images: From the Fleischner Society 2017; Radiology 2017; 284:228-243. Electronically Signed   By: Genevie Ann M.D.   On: 05/28/2018 23:58    Medications / Allergies: per chart  Antibiotics: Anti-infectives (From admission, onward)   Start     Dose/Rate Route Frequency Ordered Stop   05/29/18 0000  cefOXitin (MEFOXIN) 2 g in sodium chloride 0.9 % 100 mL IVPB     2 g 200 mL/hr over 30 Minutes Intravenous  Once 05/28/18 2358 05/29/18 0040        Note: Portions of this report may have been transcribed using voice recognition software. Every effort was made to ensure accuracy; however, inadvertent computerized transcription errors may  be present.   Any transcriptional errors that result from this process are unintentional.    Adin Hector, MD, FACS, MASCRS Gastrointestinal and Minimally Invasive Surgery    [address] [place], [place] [address] [phone] Main / Paging [phone] Fax   05/29/2018

## 2018-05-30 ENCOUNTER — Encounter (HOSPITAL_COMMUNITY): Payer: Self-pay | Admitting: General Surgery

## 2018-05-30 LAB — GLUCOSE, CAPILLARY: Glucose-Capillary: 105 mg/dL — ABNORMAL HIGH (ref 70–99)

## 2018-05-30 LAB — CBC
HCT: 33.4 % — ABNORMAL LOW (ref 39.0–52.0)
Hemoglobin: 9.5 g/dL — ABNORMAL LOW (ref 13.0–17.0)
MCH: 22.9 pg — ABNORMAL LOW (ref 26.0–34.0)
MCHC: 28.4 g/dL — ABNORMAL LOW (ref 30.0–36.0)
MCV: 80.5 fL (ref 80.0–100.0)
NRBC: 0 % (ref 0.0–0.2)
Platelets: 296 10*3/uL (ref 150–400)
RBC: 4.15 MIL/uL — ABNORMAL LOW (ref 4.22–5.81)
RDW: 17.9 % — ABNORMAL HIGH (ref 11.5–15.5)
WBC: 12.2 10*3/uL — ABNORMAL HIGH (ref 4.0–10.5)

## 2018-05-30 MED ORDER — HYDROMORPHONE HCL 1 MG/ML IJ SOLN
1.0000 mg | INTRAMUSCULAR | Status: DC | PRN
  Administered 2018-05-31: 1 mg via INTRAVENOUS
  Filled 2018-05-30: qty 1

## 2018-05-30 MED ORDER — METHOCARBAMOL 500 MG PO TABS
750.0000 mg | ORAL_TABLET | Freq: Four times a day (QID) | ORAL | Status: DC
  Administered 2018-05-30 – 2018-05-31 (×4): 750 mg via ORAL
  Filled 2018-05-30 (×4): qty 2

## 2018-05-30 MED ORDER — IBUPROFEN 200 MG PO TABS: 600.0000 mg | ORAL_TABLET | Freq: Four times a day (QID) | ORAL | Status: DC | PRN

## 2018-05-30 MED ORDER — ACETAMINOPHEN 500 MG PO TABS
1000.0000 mg | ORAL_TABLET | Freq: Three times a day (TID) | ORAL | Status: DC
  Administered 2018-05-30 – 2018-05-31 (×3): 1000 mg via ORAL
  Filled 2018-05-30 (×3): qty 2

## 2018-05-30 MED ORDER — DOCUSATE SODIUM 100 MG PO CAPS
200.0000 mg | ORAL_CAPSULE | Freq: Every day | ORAL | Status: DC
  Administered 2018-05-30 – 2018-05-31 (×2): 200 mg via ORAL
  Filled 2018-05-30 (×2): qty 2

## 2018-05-30 NOTE — Progress Notes (Signed)
1 Day Post-Op    CC:  Rectal pain  Subjective: Patient is doing fairly well taking a lot of pain medicine so far.  We gave him some pain medicine and then took his dressing down and remove the packing.  I took a picture for epic and showed him what we were dealing with.  Objective: Vital signs in last 24 hours: Temp:  [97.4 F (36.3 C)-97.9 F (36.6 C)] 97.4 F (36.3 C) (03/13 0511) Pulse Rate:  [56-80] 56 (03/13 0511) Resp:  [15-21] 16 (03/13 0511) BP: (107-132)/(69-87) 107/72 (03/13 0511) SpO2:  [93 %-100 %] 98 % (03/13 0511) Weight:  [93.6 kg] 93.6 kg (03/13 0511) Last BM Date: 05/29/18 Dilaudid x7 oxycodone x3 yesterday and 3 more times this a.m. 240 p.o.  3800 IV  925 urine  Afebrile vital signs are stable BBC still elevated at 12.2 H/H down slightly 9.5/33  intake/Output from previous day: 03/12 0701 - 03/13 0700 In: 4030.4 [P.O.:240; I.V.:3692.1; IV Piggyback:98.4] Out: 950 [Urine:925; Blood:25] Intake/Output this shift: Total I/O In: 400 [P.O.:400] Out: -   General appearance: alert, cooperative, no distress and Having a fair amount of discomfort. Resp: clear to auscultation bilaterally Dressing was removed and the site is clean, no purulent drainage.  No erythema see the picture below.   Lab Results:  Recent Labs    05/28/18 2152 05/30/18 0830  WBC 13.5* 12.2*  HGB 11.0* 9.5*  HCT 38.1* 33.4*  PLT 299 296    BMET Recent Labs    05/28/18 2152  NA 134*  K 3.4*  CL 104  CO2 19*  GLUCOSE 73  BUN 12  CREATININE 0.86  CALCIUM 9.7   PT/INR No results for input(s): LABPROT, INR in the last 72 hours.  No results for input(s): AST, ALT, ALKPHOS, BILITOT, PROT, ALBUMIN in the last 168 hours.   Lipase  No results found for: LIPASE   Medications: . Chlorhexidine Gluconate Cloth  6 each Topical Once  . docusate sodium  200 mg Oral Daily  . enoxaparin (LOVENOX) injection  40 mg Subcutaneous Q24H  . gabapentin  300 mg Oral BID  . lip balm  1  application Topical BID  . nicotine  14 mg Transdermal Daily  . pantoprazole  40 mg Oral Daily   . sodium chloride 1,000 mL (05/30/18 0510)  . sodium chloride Stopped (05/29/18 0506)  . lactated ringers    . lactated ringers 100 mL/hr at 05/30/18 0926  . methocarbamol (ROBAXIN) IV    . piperacillin-tazobactam (ZOSYN)  IV 3.375 g (05/30/18 0507)   Anti-infectives (From admission, onward)   Start     Dose/Rate Route Frequency Ordered Stop   05/29/18 1043  metroNIDAZOLE (FLAGYL) 5-0.79 MG/ML-% IVPB    Note to Pharmacy:  Randa Evens  : cabinet override      05/29/18 1043 05/29/18 1252   05/29/18 0600  ceFAZolin (ANCEF) IVPB 2g/100 mL premix     2 g 200 mL/hr over 30 Minutes Intravenous On call to O.R. 05/29/18 0507 05/29/18 1247   05/29/18 0600  metroNIDAZOLE (FLAGYL) IVPB 500 mg     500 mg 100 mL/hr over 60 Minutes Intravenous On call to O.R. 05/29/18 0507 05/29/18 1252   05/29/18 0600  piperacillin-tazobactam (ZOSYN) IVPB 3.375 g     3.375 g 12.5 mL/hr over 240 Minutes Intravenous Every 8 hours 05/29/18 0524     05/29/18 0000  cefOXitin (MEFOXIN) 2 g in sodium chloride 0.9 % 100 mL IVPB     2  g 200 mL/hr over 30 Minutes Intravenous  Once 05/28/18 2358 05/29/18 0040      Assessment/Plan  Perirectal abscess Incision and drainage/debridement of perirectal abscess and placement of a Penrose drain in the perirectal space, 05/29/2018 Autumn Messing  FEN: IV fluids/clears ID: Rocephin/Flagyl 3/12; Zosyn 3/13 >> DVT: Lovenox Follow-up: Dr. Annye English  Plan: Continue antibiotics, shower and sitz baths, work on pain control.  Advance to a regular diet.  He has scheduled Tylenol, and Robaxin.  He also has ibuprofen, oxycodone and Dilaudid as a backup for pain control. Plan to keep him today recheck his labs in the a.m. and discussed discharge at that time.   LOS: 1 day    Jonn Chaikin 05/30/2018 [phone]

## 2018-05-30 NOTE — Discharge Instructions (Signed)
Anorectal Abscess Shower and keep the site as clean as possible.  Dry dressing over site and feel free to wash site whenever it is soiled. An abscess is an infected area that contains a collection of pus. An anorectal abscess is an abscess that is near the opening of the anus or around the rectum. Without treatment, an anorectal abscess can become larger and cause other problems, such as a more serious body-wide infection or pain, especially during bowel movements. What are the causes? This condition is caused by plugged glands or an infection in one of these areas:  The anus.  The area between the anus and the scrotum in males or between the anus and the vagina in females (perineum). What increases the risk? The following factors may make you more likely to develop this condition:  Diabetes or inflammatory bowel disease.  Having a body defense system (immune system) that is weak.  Engaging in anal sex.  Having a sexually transmitted infection (STI).  Certain kinds of cancer, such as rectal carcinoma, leukemia, or lymphoma. What are the signs or symptoms? The main symptom of this condition is pain. The pain may be a throbbing pain that gets worse during bowel movements. Other symptoms include:  Swelling and redness in the area of the abscess. The redness may go beyond the abscess and appear as a red streak on the skin.  A visible, painful lump, or a lump that can be felt when touched.  Bleeding or pus-like discharge from the area.  Fever.  General weakness.  Constipation.  Diarrhea. How is this diagnosed? This condition is diagnosed based on your medical history and a physical exam of the affected area.  This may involve examining the rectal area with a gloved hand (digital rectal exam).  Sometimes, the health care provider needs to look into the rectum using a probe, scope, or imaging test.  For women, it may require a careful vaginal exam. How is this  treated? Treatment for this condition may include:  Incision and drainage surgery. This involves making an incision over the abscess to drain the pus.  Medicines, including antibiotic medicine, pain medicine, stool softeners, or laxatives. Follow these instructions at home: Medicines  Take over-the-counter and prescription medicines only as told by your health care provider.  If you were prescribed an antibiotic medicine, use it as told by your health care provider. Do not stop using the antibiotic even if you start to feel better.  Do not drive or use heavy machinery while taking prescription pain medicine. Wound care   If gauze was used in the abscess, follow instructions from your health care provider about removing or changing the gauze. It can usually be removed in 2-3 days.  Wash your hands with soap and water before you remove or change your gauze. If soap and water are not available, use hand sanitizer.  If one or more drains were placed in the abscess cavity, be careful not to pull at them. Your health care provider will tell you how long they need to remain in place.  Check your incision area every day for signs of infection. Check for: ? More redness, swelling, or pain. ? More fluid or blood. ? Warmth. ? Pus or a bad smell. Managing pain, stiffness, and swelling   Take a sitz bath 3-4 times a day and after bowel movements. This will help reduce pain and swelling.  To relieve pain, try sitting: ? On a heating pad with the setting on low. ?  On an inflatable donut-shaped cushion.  If directed, put ice on the affected area: ? Put ice in a plastic bag. ? Place a towel between your skin and the bag. ? Leave the ice on for 20 minutes, 2-3 times a day. General instructions  Follow any diet instructions given by your health care provider.  Keep all follow-up visits as told by your health care provider. This is important. Contact a health care provider if you  have:  Bleeding from your incision.  Pain, swelling, or redness that does not improve or gets worse.  Trouble passing stool or urine.  Symptoms that return after treatment. Get help right away if you:  Have problems moving or using your legs.  Have severe or increasing pain.  Have swelling in the affected area that suddenly gets worse.  Have a large increase in bleeding or passing of pus.  Develop chills or a fever. Summary  An anorectal abscess is an abscess that is near the opening of the anus or around the rectum. An abscess is an infected area that contains a collection of pus.  The main symptom of this condition is pain. It may be a throbbing pain that gets worse during bowel movements.  Treatment for an anorectal abscess may include surgery to drain the pus from the abscess. Medicines and sitz baths may also be a part of your treatment plan. This information is not intended to replace advice given to you by your health care provider. Make sure you discuss any questions you have with your health care provider. Document Released: 03/02/2000 Document Revised: 04/11/2017 Document Reviewed: 04/11/2017 Elsevier Interactive Patient Education  2019 [employer].  How to Take a [employer] A sitz bath is a warm water bath that may be used to care for your rectum, genital area, or the area between your rectum and genitals (perineum). For a sitz bath, the water only comes up to your hips and covers your buttocks. A sitz bath may done at home in a bathtub or with a portable sitz bath that fits over the toilet. Your health care provider may recommend a sitz bath to help:  Relieve pain and discomfort after delivering a baby.  Relieve pain and itching from hemorrhoids or anal fissures.  Relieve pain after certain surgeries.  Relax muscles that are sore or tight. How to take a sitz bath Take 3-4 sitz baths a day, or as many as told by your health care provider. Bathtub sitz bath To  take a sitz bath in a bathtub: 1. Partially fill a bathtub with warm water. The water should be deep enough to cover your hips and buttocks when you are sitting in the tub. 2. If your health care provider told you to put medicine in the water, follow his or her instructions. 3. Sit in the water. 4. Open the tub drain a little, and leave it open during your bath. 5. Turn on the warm water again, enough to replace the water that is draining out. Keep the water running throughout your bath. This helps keep the water at the right level and the right temperature. 6. Soak in the water for 15-20 minutes, or as long as told by your health care provider. 7. When you are done, be careful when you stand up. You may feel dizzy. 8. After the sitz bath, pat yourself dry. Do not rub your skin to dry it.  Over-the-toilet sitz bath To take a sitz bath with an over-the-toilet basin: 1. Follow  the manufacturer's instructions. 2. Fill the basin with warm water. 3. If your health care provider told you to put medicine in the water, follow his or her instructions. 4. Sit on the seat. Make sure the water covers your buttocks and perineum. 5. Soak in the water for 15-20 minutes, or as long as told by your health care provider. 6. After the sitz bath, pat yourself dry. Do not rub your skin to dry it. 7. Clean and dry the basin between uses. 8. Discard the basin if it cracks, or according to the manufacturer's instructions. Contact a health care provider if:  Your symptoms get worse. Do not continue with sitz baths if your symptoms get worse.  You have new symptoms. If this happens, do not continue with sitz baths until you talk with your health care provider. Summary  A sitz bath is a warm water bath in which the water only comes up to your hips and covers your buttocks.  A sitz bath may help relieve itching, relieve pain, and relax muscles that are sore or tight in the lower part of your body, including your  genital area.  Take 3-4 sitz baths a day, or as many as told by your health care provider. Soak in the water for 15-20 minutes.  Do not continue with sitz baths if your symptoms get worse. This information is not intended to replace advice given to you by your health care provider. Make sure you discuss any questions you have with your health care provider. Document Released: 11/26/2003 Document Revised: 03/07/2017 Document Reviewed: 03/07/2017 Elsevier Interactive Patient Education  2019 [employer].  (Not that there is anything wrong with yours, but this will give you some insight on how to keep your stool soft and minimize pain with Bowel movements)   Irregular bowel habits such as constipation and diarrhea can lead to many problems over time.  Having one soft bowel movement a day is the most important way to prevent further problems.  The anorectal canal is designed to handle stretching and feces to safely manage our ability to get rid of solid waste (feces, poop, stool) out of our body.  BUT, hard constipated stools can act like ripping concrete bricks and diarrhea can be a burning fire to this very sensitive area of our body, causing inflamed hemorrhoids, anal fissures, increasing risk is perirectal abscesses, abdominal pain/bloating, an making irritable bowel worse.     The goal: ONE SOFT BOWEL MOVEMENT A DAY!  To have soft, regular bowel movements:   Drink at least 8 tall glasses of water a day.    Take plenty of fiber.  Fiber is the undigested part of plant food that passes into the colon, acting s natures broom to encourage bowel motility and movement.  Fiber can absorb and hold large amounts of water. This results in a larger, bulkier stool, which is soft and easier to pass. Work gradually over several weeks up to 6 servings a day of fiber (25g a day even more if needed) in the form of: o Vegetables -- Root (potatoes, carrots, turnips), leafy green  (lettuce, salad greens, celery, spinach), or cooked high residue (cabbage, broccoli, etc) o Fruit -- Fresh (unpeeled skin & pulp), Dried (prunes, apricots, cherries, etc ),  or stewed ( applesauce)  o Whole grain breads, pasta, etc (whole wheat)  o Bran cereals   Bulking Agents -- This type of water-retaining fiber generally is easily obtained each day  by one of the following:  o Psyllium bran -- The psyllium plant is remarkable because its ground seeds can retain so much water. This product is available as Metamucil, Konsyl, Effersyllium, Per Diem Fiber, or the less expensive generic preparation in drug and health food stores. Although labeled a laxative, it really is not a laxative.  o Methylcellulose -- This is another fiber derived from wood which also retains water. It is available as Citrucel. o Polyethylene Glycol - and artificial fiber commonly called Miralax or Glycolax.  It is helpful for people with gassy or bloated feelings with regular fiber o Flax Seed - a less gassy fiber than psyllium  No reading or other relaxing activity while on the toilet. If bowel movements take longer than 5 minutes, you are too constipated  AVOID CONSTIPATION.  High fiber and water intake usually takes care of this.  Sometimes a laxative is needed to stimulate more frequent bowel movements, but   Laxatives are not a good long-term solution as it can wear the colon out. o Osmotics (Milk of Magnesia, Fleets phosphosoda, Magnesium citrate, MiraLax, GoLytely) are safer than  o Stimulants (Senokot, Castor Oil, Dulcolax, Ex Lax)    o Do not take laxatives for more than 7days in a row.   IF SEVERELY CONSTIPATED, try a Bowel Retraining Program: o Do not use laxatives.  o Eat a diet high in roughage, such as bran cereals and leafy vegetables.  o Drink six (6) ounces of prune or apricot juice each morning.  o Eat two (2) large servings of stewed fruit each day.  o Take one (1) heaping tablespoon of a  psyllium-based bulking agent twice a day. Use sugar-free sweetener when possible to avoid excessive calories.  o Eat a normal breakfast.  o Set aside 15 minutes after breakfast to sit on the toilet, but do not strain to have a bowel movement.  o If you do not have a bowel movement by the third day, use an enema and repeat the above steps.   Controlling diarrhea o Switch to liquids and simpler foods for a few days to avoid stressing your intestines further. o Avoid dairy products (especially milk & ice cream) for a short time.  The intestines often can lose the ability to digest lactose when stressed. o Avoid foods that cause gassiness or bloating.  Typical foods include beans and other legumes, cabbage, broccoli, and dairy foods.  Every person has some sensitivity to other foods, so listen to our body and avoid those foods that trigger problems for you. o Adding fiber (Citrucel, Metamucil, psyllium, Miralax) gradually can help thicken stools by absorbing excess fluid and retrain the intestines to act more normally.  Slowly increase the dose over a few weeks.  Too much fiber too soon can backfire and cause cramping & bloating. o Probiotics (such as active yogurt, Align, etc) may help repopulate the intestines and colon with normal bacteria and calm down a sensitive digestive tract.  Most studies show it to be of mild help, though, and such products can be costly. o Medicines: - Bismuth subsalicylate (ex. Kayopectate, Pepto Bismol) every 30 minutes for up to 6 doses can help control diarrhea.  Avoid if pregnant. - Loperamide (Immodium) can slow down diarrhea.  Start with two tablets (4mg  total) first and then try one tablet every 6 hours.  Avoid if you are having fevers or severe pain.  If you are not better or start feeling worse, stop all medicines and call your  doctor for advice o Call your doctor if you are getting worse or not better.  Sometimes further testing (cultures, endoscopy, X-ray studies,  bloodwork, etc) may be needed to help diagnose and treat the cause of the diarrhea.

## 2018-05-31 LAB — CBC
HCT: 29.8 % — ABNORMAL LOW (ref 39.0–52.0)
Hemoglobin: 8.5 g/dL — ABNORMAL LOW (ref 13.0–17.0)
MCH: 22.8 pg — ABNORMAL LOW (ref 26.0–34.0)
MCHC: 28.5 g/dL — ABNORMAL LOW (ref 30.0–36.0)
MCV: 80.1 fL (ref 80.0–100.0)
NRBC: 0 % (ref 0.0–0.2)
Platelets: 259 10*3/uL (ref 150–400)
RBC: 3.72 MIL/uL — ABNORMAL LOW (ref 4.22–5.81)
RDW: 18 % — ABNORMAL HIGH (ref 11.5–15.5)
WBC: 10.2 10*3/uL (ref 4.0–10.5)

## 2018-05-31 LAB — BASIC METABOLIC PANEL
Anion gap: 6 (ref 5–15)
BUN: 17 mg/dL (ref 6–20)
CALCIUM: 8.6 mg/dL — AB (ref 8.9–10.3)
CO2: 27 mmol/L (ref 22–32)
Chloride: 107 mmol/L (ref 98–111)
Creatinine, Ser: 0.77 mg/dL (ref 0.61–1.24)
GFR calc Af Amer: >60 mL/min (ref >60)
GFR calc non Af Amer: >60 mL/min (ref >60)
Glucose, Bld: 112 mg/dL — ABNORMAL HIGH (ref 70–99)
Potassium: 3.5 mmol/L (ref 3.5–5.1)
Sodium: 140 mmol/L (ref 135–145)

## 2018-05-31 MED ORDER — OXYCODONE-ACETAMINOPHEN 5-325 MG PO TABS: 1.0000 | ORAL_TABLET | Freq: Four times a day (QID) | ORAL | 0 refills | Status: DC | PRN

## 2018-05-31 MED ORDER — METHOCARBAMOL 750 MG PO TABS: 500.0000 mg | ORAL_TABLET | Freq: Four times a day (QID) | ORAL | 1 refills | Status: DC

## 2018-05-31 MED ORDER — AMOXICILLIN-POT CLAVULANATE 875-125 MG PO TABS: 1.0000 | ORAL_TABLET | Freq: Two times a day (BID) | ORAL | 0 refills | Status: AC

## 2018-05-31 NOTE — TOC Transition Note (Signed)
Transition of Care [facility]) - CM/SW Discharge Note   Patient Details  Name: Carlos Daugherty MRN: [id number] Date of Birth: [birth year]  [facility] CM/SW Contact:  Leeroy Cha, RN Phone Number: 05/31/2018, 9:36 AM   Clinical Narrative:    Discharged to home with self-care, orders checked for hhc needs. No TOC needs present at time of discharge.  Patient is able to arrangement own appointments and home care.   Final next level of care: Home/Self Care Barriers to Discharge: No Barriers Identified   Patient Goals and CMS Choice Patient states their goals for this hospitalization and ongoing recovery are:: just to stay well      Discharge Placement  home                     Discharge Plan and Services  home and self care                      Social Determinants of Health (SDOH) Interventions     Readmission Risk Interventions No flowsheet data found.

## 2018-05-31 NOTE — Plan of Care (Signed)
Reviewed discharge instructions with patient; copy given. IV removed. Patient ready for discharge.  

## 2018-05-31 NOTE — Progress Notes (Signed)
After discharge, chart broken down and IV Fentanyl and PO Celebrex found in chart. IV Fentanyl was vial 140mcg/2ml with 52mcg/1ml drawn up in syringe. Vial empty. Wasted in sharps container with Evalee Jefferson RN as witness.

## 2018-05-31 NOTE — Plan of Care (Signed)
Patient lying in bed this morning; c/o pain 7-8/10 in rectal area. Pain medication given. Pt would like to go home today and is waiting for physician to round. Will continue to monitor.

## 2018-05-31 NOTE — Progress Notes (Signed)
2 Days Post-Op   Subjective/Chief Complaint: Complains of some rectal pain but seems manageable   Objective: Vital signs in last 24 hours: Temp:  [97.4 F (36.3 C)-97.6 F (36.4 C)] 97.4 F (36.3 C) (03/14 3557) Pulse Rate:  [62-88] 62 (03/14 0632) Resp:  [16] 16 (03/14 3220) BP: (115-129)/(69-88) 122/83 (03/14 2542) SpO2:  [98 %-99 %] 99 % (03/14 7062) Last BM Date: 05/31/18  Intake/Output from previous day: 03/13 0701 - 03/14 0700 In: 2524.7 [P.O.:880; I.V.:1539; IV Piggyback:105.7] Out: 1000 [Urine:1000] Intake/Output this shift: No intake/output data recorded.  General appearance: alert and cooperative Resp: clear to auscultation bilaterally Cardio: regular rate and rhythm GI: soft, nontender. penrose in place at rectum. moderate soupy drainage  Lab Results:  Recent Labs    05/30/18 0830 05/31/18 0432  WBC 12.2* 10.2  HGB 9.5* 8.5*  HCT 33.4* 29.8*  PLT 296 259   BMET Recent Labs    05/28/18 2152 05/31/18 0432  NA 134* 140  K 3.4* 3.5  CL 104 107  CO2 19* 27  GLUCOSE 73 112*  BUN 12 17  CREATININE 0.86 0.77  CALCIUM 9.7 8.6*   PT/INR No results for input(s): LABPROT, INR in the last 72 hours. ABG No results for input(s): PHART, HCO3 in the last 72 hours.  Invalid input(s): PCO2, PO2  Studies/Results: No results found.  Anti-infectives: Anti-infectives (From admission, onward)   Start     Dose/Rate Route Frequency Ordered Stop   05/29/18 1043  metroNIDAZOLE (FLAGYL) 5-0.79 MG/ML-% IVPB    Note to Pharmacy:  Randa Evens  : cabinet override      05/29/18 1043 05/29/18 1252   05/29/18 0600  ceFAZolin (ANCEF) IVPB 2g/100 mL premix     2 g 200 mL/hr over 30 Minutes Intravenous On call to O.R. 05/29/18 0507 05/29/18 1247   05/29/18 0600  metroNIDAZOLE (FLAGYL) IVPB 500 mg     500 mg 100 mL/hr over 60 Minutes Intravenous On call to O.R. 05/29/18 0507 05/29/18 1252   05/29/18 0600  piperacillin-tazobactam (ZOSYN) IVPB 3.375 g     3.375  g 12.5 mL/hr over 240 Minutes Intravenous Every 8 hours 05/29/18 0524     05/29/18 0000  cefOXitin (MEFOXIN) 2 g in sodium chloride 0.9 % 100 mL IVPB     2 g 200 mL/hr over 30 Minutes Intravenous  Once 05/28/18 2358 05/29/18 0040      Assessment/Plan: s/p Procedure(s): IRRIGATION AND DEBRIDEMENT PERIRECTAL ABSCESS (N/A) Advance diet  Continue dressing changes and wound care.  Pt wants to go home. Plan for d/c with close follow up  LOS: 2 days    Autumn Messing III 05/31/2018

## 2018-06-02 LAB — AEROBIC/ANAEROBIC CULTURE W GRAM STAIN (SURGICAL/DEEP WOUND)

## 2018-06-02 LAB — AEROBIC/ANAEROBIC CULTURE (SURGICAL/DEEP WOUND)

## 2018-06-05 NOTE — Discharge Summary (Addendum)
Physician Discharge Summary  Patient ID: Carlos Daugherty MRN: [id number] DOB/AGE: 1970-05-01 48 y.o.  Admit date: 05/28/2018 Discharge date: 05/31/2018  Admission Diagnoses:  Perirectal abscess  Discharge Diagnoses:  Same  Principal Problem:   Horseshoe abscess of ischiorectal space Active Problems:   HTN (hypertension)   GERD (gastroesophageal reflux disease)   Tobacco abuse   PROCEDURES: Incision and drainage/irrigation of perirectal abscess placement of Penrose drain 05/29/2018 Dr. Autumn Messing III  Hospital Course:  Smoking male with hypertension and GERD but otherwise rather healthy.  History of hemorrhoid problems for over a decade.  Had some pain and discomfort a few weeks ago.  Thought it was another hemorrhoid flare.  Did not consistently improve.  Became more painful.  Much more intense over the past 3 days.  Obvious swelling.  Pain became unbearable.  Went to the emergency room at [facility].  No thrombosed hemorrhoid.  Vague pain on the right side.  CT scan done shows abscess primarily on the right side but starting to come over to the left as well.  Surgical consultation requested.  Patient transferred to [facility] where surgical care can be provided.  No personal nor family history of GI/colon cancer, inflammatory bowel disease, irritable bowel syndrome, allergy such as Celiac Sprue, dietary/dairy problems, colitis, ulcers nor gastritis.  No recent sick contacts/gastroenteritis.  No travel outside the country.  No changes in diet.  No dysphagia to solids or liquids.  No significant heartburn or reflux.  No hematochezia, hematemesis, coffee ground emesis.  No evidence of prior gastric/peptic ulceration.  He moves his bowels every day.  No recent bout of constipation or diarrhea.  He smokes a pack of cigarettes a day.  Had a hemorrhoid flare a decade ago.  Does not recall any other abscesses.  Patient was seen in the office by Dr. Michael Boston admitted for  incision and drainage of an abscess that was too large and tender to do in the office.  Surgery was performed on 05/28/2013, at surgery a 1/4 inch Penrose drain was placed through this external opening into the abscess cavity and then out the anus through the large opening already created by the abscess cavity.  The 2 ends of the Penrose drain were connected with a 3-0 nylon stitch.  There was some firm nodular tissue in the cavity opening within the rectum.  Some of this tissue was biopsied with Metzenbaum scissors and sent to pathology for further evaluation. Packing was removed on the first postoperative day.  We started him on showers and sitz baths at that time.  He was kept in the off hospital for additional antibiotics and pain control.  He was seen on 05/31/2018 by Dr. Marlou Starks.  He continued to have some rectal pain but it seems manageable and he was discharged home. Diagnosis  Pathology from the biopsy Soft tissue, debridement, perirectal abscess wall - SQUAMOUS MUCOSA WITH MARKED INFLAMMATION AND FOCAL ABSCESS. - NO DYSPLASIA OR MALIGNANCY.  He will be discharged home on sitz bath's/showers.  The drain remains in place he has 14 days of oral antibiotics.  He will follow-up with 1 of our colorectal surgeons Dr. Nadeen Landau.  Condition on discharge: Improving.  CBC Latest Ref Rng & Units 05/31/2018 05/30/2018 05/28/2018  WBC 4.0 - 10.5 K/uL 10.2 12.2(H) 13.5(H)  Hemoglobin 13.0 - 17.0 g/dL 8.5(L) 9.5(L) 11.0(L)  Hematocrit 39.0 - 52.0 % 29.8(L) 33.4(L) 38.1(L)  Platelets 150 - 400 K/uL 259 296 299   CMP Latest  Ref Rng & Units 05/31/2018 05/28/2018 08/30/2014  Glucose 70 - 99 mg/dL 112(H) 73 112(H)  BUN 6 - 20 mg/dL 17 12 13   Creatinine 0.61 - 1.24 mg/dL 0.77 0.86 0.89  Sodium 135 - 145 mmol/L 140 134(L) 139  Potassium 3.5 - 5.1 mmol/L 3.5 3.4(L) 5.1  Chloride 98 - 111 mmol/L 107 104 104  CO2 22 - 32 mmol/L 27 19(L) 25  Calcium 8.9 - 10.3 mg/dL 8.6(L) 9.7 10.0  Total Protein 6.0 - 8.3  g/dL - - 7.9  Total Bilirubin 0.2 - 1.2 mg/dL - - 0.3  Alkaline Phos 39 - 117 U/L - - 79  AST 0 - 37 U/L - - 23  ALT 0 - 53 U/L - - 42    Disposition:   Home    Discharge Instructions    Call MD for:  difficulty breathing, headache or visual disturbances   Complete by:  As directed    Call MD for:  extreme fatigue   Complete by:  As directed    Call MD for:  hives   Complete by:  As directed    Call MD for:  persistant dizziness or light-headedness   Complete by:  As directed    Call MD for:  persistant nausea and vomiting   Complete by:  As directed    Call MD for:  redness, tenderness, or signs of infection (pain, swelling, redness, odor or green/yellow discharge around incision site)   Complete by:  As directed    Call MD for:  severe uncontrolled pain   Complete by:  As directed    Call MD for:  temperature >100.4   Complete by:  As directed    Diet - low sodium heart healthy   Complete by:  As directed    Discharge instructions   Complete by:  As directed    May shower. Change outer gauze as needed. Shower or bathe after bm's. Diet as tolerated   Increase activity slowly   Complete by:  As directed      Allergies as of 05/31/2018   No Known Allergies     Medication List    TAKE these medications   amLODipine 5 MG tablet Commonly known as:  NORVASC Take 5 mg by mouth daily.   amoxicillin-clavulanate 875-125 MG tablet Commonly known as:  Augmentin Take 1 tablet by mouth 2 (two) times daily for 14 days.   diazepam 5 MG tablet Commonly known as:  VALIUM Take 1-2 tablets (5-10 mg total) by mouth every 6 (six) hours as needed for muscle spasms.   esomeprazole 20 MG capsule Commonly known as:  NEXIUM Take 40 mg by mouth daily at 12 noon.   Fusion Plus Caps TAKE 1 CAPSULE BY MOUTH TWICE DAILY   ibuprofen 200 MG tablet Commonly known as:  ADVIL,MOTRIN Take 600 mg by mouth 2 (two) times daily as needed (pain).   methocarbamol 750 MG tablet Commonly known  as:  ROBAXIN Take 0.5 tablets (375 mg total) by mouth 4 (four) times daily.   oxyCODONE-acetaminophen 5-325 MG tablet Commonly known as:  PERCOCET/ROXICET Take 1-2 tablets by mouth every 6 (six) hours as needed for moderate pain. What changed:  when to take this      Follow-up Information    Ileana Roup, MD Follow up on 06/17/2018.   Specialty:  General Surgery Why:  Your appointment is at 9:45 AM.  Be at the office 30 minutes early for check-in.  Bring photo ID and  insurance information.   Contact information: [place] [address] [phone]           Signed: Earnstine Regal 06/05/2018, 2:12 PM

## 2018-07-09 ENCOUNTER — Other Ambulatory Visit: Payer: Self-pay | Admitting: Surgery

## 2018-07-09 DIAGNOSIS — K603 Anal fistula: Secondary | ICD-10-CM

## 2018-07-10 ENCOUNTER — Other Ambulatory Visit: Payer: Self-pay

## 2018-07-10 ENCOUNTER — Ambulatory Visit
Admission: RE | Admit: 2018-07-10 | Discharge: 2018-07-10 | Disposition: A | Discharge status: Home / Self Care | Payer: BLUE CROSS/BLUE SHIELD | Source: Ambulatory Visit | Attending: Surgery | Admitting: Surgery

## 2018-07-10 DIAGNOSIS — K603 Anal fistula: Secondary | ICD-10-CM | POA: Diagnosis not present

## 2018-07-10 MED ORDER — IOPAMIDOL (ISOVUE-300) INJECTION 61%
100.0000 mL | Freq: Once | INTRAVENOUS | Status: AC | PRN
  Administered 2018-07-10: 100 mL via INTRAVENOUS

## 2018-07-24 ENCOUNTER — Ambulatory Visit (INDEPENDENT_AMBULATORY_CARE_PROVIDER_SITE_OTHER): Payer: BLUE CROSS/BLUE SHIELD | Admitting: Gastroenterology

## 2018-07-24 ENCOUNTER — Encounter: Payer: Self-pay | Admitting: Gastroenterology

## 2018-07-24 ENCOUNTER — Other Ambulatory Visit: Payer: Self-pay

## 2018-07-24 VITALS — Ht 76.0 in | Wt 200.0 lb

## 2018-07-24 DIAGNOSIS — K612 Anorectal abscess: Secondary | ICD-10-CM | POA: Diagnosis not present

## 2018-07-24 DIAGNOSIS — K603 Anal fistula: Secondary | ICD-10-CM | POA: Diagnosis not present

## 2018-07-24 MED ORDER — TINIDAZOLE 250 MG PO TABS: 250.0000 mg | ORAL_TABLET | Freq: Every day | ORAL | 0 refills | Status: DC

## 2018-07-24 NOTE — Progress Notes (Signed)
Carlos Daugherty    [id number]    12-Feb-1971  Primary Care Physician:Webb, Arbie Cookey, MD  Referring Physician: Maurice Small, MD [place], [place] [address]  This service was provided via audio and video telemedicine (Doximity) due to [place] 19 pandemic.  Patient location: Home Provider location: Office Used 2 patient identifiers to confirm the correct person. Explained the limitations in evaluation and management via telemedicine. Patient is aware of potential medical charges for this visit.  Patient consented to this virtual visit.  The persons participating in this telemedicine service were myself and the patient    Chief complaint: Rectal pain, anal rectal fistula  HPI: 48 year old male with history of perirectal abscess status post I&D with nonhealing anal fistula and persistent drainage since March 2020. He had upsizing of anal seton.  He has significant pain in the anorectal region. Denies diarrhea.  He has 2-3 soft bowel movements daily with occasional. No fever or chills.  He was on antibiotics initially, not on any currently.   CT May 28, 2018: 1. Complex and large multiloculated perianal abscess. The largest component is on the right of midline, tracks toward the pelvic floor, and has an estimated volume of 54 mL. No pelvic free fluid or other complicating features   CT Pelvis 07/10/18 Status post incision and drainage of a perirectal abscess with a surgical drain in place. A few small locules of soft tissue gas are identified but no residual abscess is seen.  Mild diverticulosis without diverticulitis.  Outpatient Encounter Medications as of 07/24/2018  Medication Sig  . esomeprazole (NEXIUM) 20 MG capsule Take 40 mg by mouth daily at 12 noon.  Marland Kitchen ibuprofen (ADVIL,MOTRIN) 200 MG tablet Take 600 mg by mouth 2 (two) times daily as needed (pain).  . methocarbamol (ROBAXIN) 750 MG tablet Take 0.5 tablets (375 mg total) by mouth 4  (four) times daily.  Marland Kitchen amLODipine (NORVASC) 5 MG tablet Take 5 mg by mouth daily.  Marland Kitchen oxyCODONE-acetaminophen (PERCOCET/ROXICET) 5-325 MG tablet Take 1-2 tablets by mouth every 6 (six) hours as needed for moderate pain. (Patient not taking: Reported on 07/24/2018)  . [DISCONTINUED] diazepam (VALIUM) 5 MG tablet Take 1-2 tablets (5-10 mg total) by mouth every 6 (six) hours as needed for muscle spasms. (Patient not taking: Reported on 07/24/2018)  . [DISCONTINUED] Iron-FA-B Cmp-C-Biot-Probiotic (FUSION PLUS) CAPS TAKE 1 CAPSULE BY MOUTH TWICE DAILY (Patient not taking: Reported on 07/24/2018)   No facility-administered encounter medications on file as of 07/24/2018.     Allergies as of 07/24/2018  . (No Known Allergies)    Past Medical History:  Diagnosis Date  . Arthritis   . GERD (gastroesophageal reflux disease)     Past Surgical History:  Procedure Laterality Date  . HERNIA REPAIR     X 2  . INCISION AND DRAINAGE PERIRECTAL ABSCESS N/A 05/29/2018   Procedure: IRRIGATION AND DEBRIDEMENT PERIRECTAL ABSCESS;  Surgeon: Jovita Kussmaul, MD;  Location: [facility];  Service: General;  Laterality: N/A;  . MAXIMUM ACCESS (MAS)POSTERIOR LUMBAR INTERBODY FUSION (PLIF) 1 LEVEL N/A 09/27/2014   Procedure:  L4-L5 mas PLIF  ;  Surgeon: Earnie Larsson, MD;  Location: [facility];  Service: Neurosurgery;  Laterality: N/A;   L4-L5 mas PLIF    . NASAL SINUS SURGERY    . UPPER GASTROINTESTINAL ENDOSCOPY     looking d/t acid reflux    Family History  Problem Relation Age of Onset  .  Leukemia Father     Social History   Socioeconomic History  . Marital status: Married    Spouse name: Not on file  . Number of children: Not on file  . Years of education: Not on file  . Highest education level: Not on file  Occupational History  . Not on file  Social Needs  . Financial resource strain: Not on file  . Food insecurity:    Worry: Not on file    Inability: Not on file  . Transportation needs:    Medical: Not  on file    Non-medical: Not on file  Tobacco Use  . Smoking status: Current Every Day Smoker    Years: 1.00    Types: Cigarettes  . Smokeless tobacco: Never Used  Substance and Sexual Activity  . Alcohol use: Yes    Comment: weekly  . Drug use: No  . Sexual activity: Not on file  Lifestyle  . Physical activity:    Days per week: Not on file    Minutes per session: Not on file  . Stress: Not on file  Relationships  . Social connections:    Talks on phone: Not on file    Gets together: Not on file    Attends religious service: Not on file    Active member of club or organization: Not on file    Attends meetings of clubs or organizations: Not on file    Relationship status: Not on file  . Intimate partner violence:    Fear of current or ex partner: Not on file    Emotionally abused: Not on file    Physically abused: Not on file    Forced sexual activity: Not on file  Other Topics Concern  . Not on file  Social History Narrative  . Not on file      Review of systems: Review of Systems as per HPI All other systems reviewed and are negative.   Physical Exam: Vitals were not taken and physical exam was not performed during this virtual visit.  Data Reviewed:  Reviewed labs, radiology imaging, old records and pertinent past GI work up   Assessment and Plan/Recommendations:  37 yr M with of degenerative disc disease, chronic GERD, HTN, perirectal abscess s/p I&D 05/2018, non healing anal fistula s/p seton with persistent discharge, bleeding and severe rectal pain.  Rectal pain: he was given prescription for percocet by [facility], (Patient's wife says he used up all the medication and was told he is not due for refill yet), and was referred to pain management clinic. Will avoid prescribing additional narcotics given [facility] (Dr Dema Severin) is managing his pain concerns.  Non healing anal fistula, will schedule for colonoscopy with biopsies to exclude Crohn's disease The risks and  benefits as well as alternatives of endoscopic procedure(s) have been discussed and reviewed. All questions answered. The patient agrees to proceed.  Start Tinidazole 250mg  daily X 4 weeks to help heal the fistula and decrease drainage.      Damaris Hippo , MD   CC: Maurice Small, MD

## 2018-07-24 NOTE — Patient Instructions (Addendum)
Start Tinidazole 250mg   Daily X 4 weeks (pick up your prescription from your pharmacy)  Schedule for colonoscopy with biopsies  07/28/2018 at 4:40pm  You have been scheduled for a colonoscopy. Please follow written instructions given to you at your visit today.  Please pick up your prep supplies at the pharmacy within the next 1-3 days. ( Suprep sample given)   If you use inhalers (even only as needed), please bring them with you on the day of your procedure.   I appreciate the  opportunity to care for you  Thank You   Harl Bowie , MD

## 2018-07-25 ENCOUNTER — Telehealth: Payer: Self-pay | Admitting: *Deleted

## 2018-07-25 ENCOUNTER — Telehealth: Payer: Self-pay

## 2018-07-25 MED ORDER — METRONIDAZOLE 500 MG PO TABS: 500.0000 mg | ORAL_TABLET | Freq: Two times a day (BID) | ORAL | 0 refills | Status: AC

## 2018-07-25 NOTE — Telephone Encounter (Signed)
After talking to Mr Carlos Daugherty. He said the pharmacy is giving him the 500 mg Tinamax and was instructed to break them in half.

## 2018-07-25 NOTE — Telephone Encounter (Signed)
Covid-19 travel screening questions  Have you traveled in the last 14 days? no If yes where?  Do you now or have you had a fever in the last 14 days? no  Do you have any respiratory symptoms of shortness of breath or cough now or in the last 14 days? no  Do you have any family members or close contacts with diagnosed or suspected Covid-19? No  Pt is aware that care partner will be waiting in car during procedure.  He will wear a mask into building

## 2018-07-25 NOTE — Telephone Encounter (Signed)
Appears to be for treatment of peri-rectal fistula, with colonoscopy planned for 5/11.  I have sent a prescription for metronidazole 500 mg , one tablet twice daily for 3 days. This will allow Dr. Silverio Decamp to re-evaluate on 5/11.  Do not consume alcohol while taking this medicine because could have serious reaction.

## 2018-07-25 NOTE — Telephone Encounter (Signed)
LMOM to confirm appt and to go over covid screening questions.  Will call back

## 2018-07-25 NOTE — Telephone Encounter (Signed)
Incoming fax from [facility]. Medication tinidazole 250 mg is unavailable from the warehouse until 07-30-2018. Please advise on recommendations.

## 2018-07-28 ENCOUNTER — Ambulatory Visit (AMBULATORY_SURGERY_CENTER): Payer: BLUE CROSS/BLUE SHIELD | Admitting: Gastroenterology

## 2018-07-28 ENCOUNTER — Other Ambulatory Visit: Payer: Self-pay

## 2018-07-28 ENCOUNTER — Encounter: Payer: Self-pay | Admitting: Gastroenterology

## 2018-07-28 VITALS — BP 152/98 | HR 69 | Temp 98.5°F | Resp 18 | Ht 76.0 in | Wt 200.0 lb

## 2018-07-28 DIAGNOSIS — D125 Benign neoplasm of sigmoid colon: Secondary | ICD-10-CM | POA: Diagnosis not present

## 2018-07-28 DIAGNOSIS — D123 Benign neoplasm of transverse colon: Secondary | ICD-10-CM

## 2018-07-28 DIAGNOSIS — K5730 Diverticulosis of large intestine without perforation or abscess without bleeding: Secondary | ICD-10-CM | POA: Diagnosis not present

## 2018-07-28 DIAGNOSIS — K603 Anal fistula: Secondary | ICD-10-CM | POA: Diagnosis not present

## 2018-07-28 DIAGNOSIS — K5289 Other specified noninfective gastroenteritis and colitis: Secondary | ICD-10-CM | POA: Diagnosis not present

## 2018-07-28 DIAGNOSIS — K648 Other hemorrhoids: Secondary | ICD-10-CM | POA: Diagnosis not present

## 2018-07-28 DIAGNOSIS — K635 Polyp of colon: Secondary | ICD-10-CM

## 2018-07-28 DIAGNOSIS — K51513 Left sided colitis with fistula: Secondary | ICD-10-CM | POA: Diagnosis not present

## 2018-07-28 MED ORDER — SODIUM CHLORIDE 0.9 % IV SOLN: 500.0000 mL | Freq: Once | INTRAVENOUS | Status: DC

## 2018-07-28 NOTE — Patient Instructions (Signed)
Information on polyps and hemorrhoids given to you.  Await pathology results.  YOU HAD AN ENDOSCOPIC PROCEDURE TODAY AT [facility]:   Refer to the procedure report that was given to you for any specific questions about what was found during the examination.  If the procedure report does not answer your questions, please call your gastroenterologist to clarify.  If you requested that your care partner not be given the details of your procedure findings, then the procedure report has been included in a sealed envelope for you to review at your convenience later.  YOU SHOULD EXPECT: Some feelings of bloating in the abdomen. Passage of more gas than usual.  Walking can help get rid of the air that was put into your GI tract during the procedure and reduce the bloating. If you had a lower endoscopy (such as a colonoscopy or flexible sigmoidoscopy) you may notice spotting of blood in your stool or on the toilet paper. If you underwent a bowel prep for your procedure, you may not have a normal bowel movement for a few days.  Please Note:  You might notice some irritation and congestion in your nose or some drainage.  This is from the oxygen used during your procedure.  There is no need for concern and it should clear up in a day or so.  SYMPTOMS TO REPORT IMMEDIATELY:   Following lower endoscopy (colonoscopy or flexible sigmoidoscopy):  Excessive amounts of blood in the stool  Significant tenderness or worsening of abdominal pains  Swelling of the abdomen that is new, acute  Fever of 100F or higher  For urgent or emergent issues, a gastroenterologist can be reached at any hour by calling [phone].   DIET:  We do recommend a small meal at first, but then you may proceed to your regular diet.  Drink plenty of fluids but you should avoid alcoholic beverages for 24 hours.  ACTIVITY:  You should plan to take it easy for the rest of today and you should NOT DRIVE or use heavy  machinery until tomorrow (because of the sedation medicines used during the test).    FOLLOW UP: Our staff will call the number listed on your records the next business day following your procedure to check on you and address any questions or concerns that you may have regarding the information given to you following your procedure. If we do not reach you, we will leave a message.  However, if you are feeling well and you are not experiencing any problems, there is no need to return our call.  We will assume that you have returned to your regular daily activities without incident. We will be calling you two weeks following your procedure to see if you have developed any symptoms of the COVID-19.  If you develop any symptoms before then, please let us know.  If any biopsies were taken you will be contacted by phone or by letter within the next 1-3 weeks.  Please call us at [phone] if you have not heard about the biopsies in 3 weeks.    SIGNATURES/CONFIDENTIALITY: You and/or your care partner have signed paperwork which will be entered into your electronic medical record.  These signatures attest to the fact that that the information above on your After Visit Summary has been reviewed and is understood.  Full responsibility of the confidentiality of this discharge information lies with you and/or your care-partner.

## 2018-07-28 NOTE — Progress Notes (Signed)
Called to room to assist during endoscopic procedure.  Patient ID and intended procedure confirmed with present staff. Received instructions for my participation in the procedure from the performing physician.  

## 2018-07-28 NOTE — Op Note (Signed)
Carlos Daugherty Patient Name: Carlos Daugherty Procedure Date: 07/28/2018 4:29 PM MRN: [id number] Endoscopist: Mauri Pole , MD Age: 48 Referring MD:  Date of Birth: 1970/03/20 Gender: Male Account #: [id number] Procedure:                Colonoscopy Indications:              Anorectal fistula. Rule out IBD/Crohn's disease Medicines:                Monitored Anesthesia Care Procedure:                Pre-Anesthesia Assessment:                           - Prior to the procedure, a History and Physical                            was performed, and patient medications and                            allergies were reviewed. The patient's tolerance of                            previous anesthesia was also reviewed. The risks                            and benefits of the procedure and the sedation                            options and risks were discussed with the patient.                            All questions were answered, and informed consent                            was obtained. Prior Anticoagulants: The patient has                            taken no previous anticoagulant or antiplatelet                            agents. ASA Grade Assessment: II - A patient with                            mild systemic disease. After reviewing the risks                            and benefits, the patient was deemed in                            satisfactory condition to undergo the procedure.                           After obtaining informed consent, the colonoscope  was passed under direct vision. Throughout the                            procedure, the patient's blood pressure, pulse, and                            oxygen saturations were monitored continuously. The                            Model PCF-[id number]) scope was introduced                            through the anus and advanced to the the terminal                            ileum,  with identification of the appendiceal                            orifice and IC valve. The colonoscopy was performed                            without difficulty. The patient tolerated the                            procedure well. The quality of the bowel                            preparation was adequate. The ileocecal valve,                            appendiceal orifice, and rectum were photographed. Scope In: 4:40:45 PM Scope Out: 5:00:29 PM Scope Withdrawal Time: 0 hours 16 minutes 13 seconds  Total Procedure Duration: 0 hours 19 minutes 44 seconds  Findings:                 Anal seton in place, normal digital rectal exam                           Three sessile polyps were found in the rectum,                            sigmoid colon and transverse colon. The polyps were                            5 to 9 mm in size. These polyps were removed with a                            cold snare. Resection and retrieval were complete.                           A 11 mm polyp was found in the sigmoid colon. The  polyp was pedunculated. The polyp was removed with                            a hot snare. Resection and retrieval were complete.                           Scattered small and large-mouthed diverticula were                            found in the sigmoid colon and descending colon.                           Normal mucosa was found in the entire colon.                            Biopsies were taken with a cold forceps for                            histology.                           Non-bleeding internal hemorrhoids were found during                            retroflexion. The hemorrhoids were small.                           The exam was otherwise without abnormality. Complications:            No immediate complications. Estimated Blood Loss:     Estimated blood loss was minimal. Impression:               - Three 5 to 9 mm polyps in the rectum, in the                             sigmoid colon and in the transverse colon, removed                            with a cold snare. Resected and retrieved.                           - One 11 mm polyp in the sigmoid colon, removed                            with a hot snare. Resected and retrieved.                           - Mild diverticulosis in the sigmoid colon and in                            the descending colon.                           - Normal mucosa in the entire examined colon.  Biopsied.                           - Non-bleeding internal hemorrhoids.                           - The examination was otherwise normal. Recommendation:           - Patient has a contact number available for                            emergencies. The signs and symptoms of potential                            delayed complications were discussed with the                            patient. Return to normal activities tomorrow.                            Written discharge instructions were provided to the                            patient.                           - Resume previous diet.                           - Continue present medications.                           - Await pathology results.                           - Repeat colonoscopy in 3 - 5 years for                            surveillance based on pathology results. Mauri Pole, MD 07/28/2018 5:11:01 PM This report has been signed electronically.

## 2018-07-29 ENCOUNTER — Telehealth: Payer: Self-pay | Admitting: *Deleted

## 2018-07-29 NOTE — Telephone Encounter (Signed)
  Follow up Call-  Call back number 07/28/2018  Post procedure Call Back phone  # [phone]  Permission to leave phone message Yes  Some recent data might be hidden    [facility]

## 2018-07-29 NOTE — Telephone Encounter (Signed)
  Follow up Call-  Call back number 07/28/2018  Post procedure Call Back phone  # [phone]  Permission to leave phone message Yes  Some recent data might be hidden     Patient questions:  Do you have a fever, pain , or abdominal swelling? No. Pain Score  0 *  Have you tolerated food without any problems? Yes.    Have you been able to return to your normal activities? Yes.    Do you have any questions about your discharge instructions: Diet   No. Medications  No. Follow up visit  No.  Do you have questions or concerns about your Care? No.  Actions: * If pain score is 4 or above: No action needed, pain <4.

## 2018-07-30 ENCOUNTER — Telehealth: Payer: Self-pay | Admitting: *Deleted

## 2018-07-30 NOTE — Telephone Encounter (Signed)
No answer. Left message for patient to return my call for Covid-19 follow up.

## 2018-07-31 NOTE — Telephone Encounter (Signed)
Called pt - LM to return my call today  At [phone]. Lenard Galloway RN

## 2018-07-31 NOTE — Telephone Encounter (Signed)
1. Have you developed a fever since your procedure? no  2.   Have you had an respiratory symptoms (SOB or cough) since your procedure? no  3.   Have you tested positive for COVID 19 since your procedure no  3.   Have you had any family members/close contacts diagnosed with the COVID 19 since your procedure?  no   If any of these questions are a yes, please inquire if patient has been seen by family doctor and route this note to Tracy Walton, RN.  

## 2018-08-06 DIAGNOSIS — G894 Chronic pain syndrome: Secondary | ICD-10-CM | POA: Diagnosis not present

## 2018-08-06 DIAGNOSIS — Z79899 Other long term (current) drug therapy: Secondary | ICD-10-CM | POA: Diagnosis not present

## 2018-08-06 DIAGNOSIS — K603 Anal fistula: Secondary | ICD-10-CM | POA: Diagnosis not present

## 2018-08-06 DIAGNOSIS — Z79891 Long term (current) use of opiate analgesic: Secondary | ICD-10-CM | POA: Diagnosis not present

## 2018-08-06 DIAGNOSIS — M961 Postlaminectomy syndrome, not elsewhere classified: Secondary | ICD-10-CM | POA: Diagnosis not present

## 2018-08-06 DIAGNOSIS — K611 Rectal abscess: Secondary | ICD-10-CM | POA: Diagnosis not present

## 2018-08-13 ENCOUNTER — Encounter: Payer: Self-pay | Admitting: Gastroenterology

## 2018-08-15 ENCOUNTER — Other Ambulatory Visit: Payer: Self-pay

## 2018-08-15 MED ORDER — MESALAMINE ER 0.375 G PO CP24: 1500.0000 mg | ORAL_CAPSULE | Freq: Every day | ORAL | 2 refills | Status: DC

## 2018-09-03 DIAGNOSIS — M961 Postlaminectomy syndrome, not elsewhere classified: Secondary | ICD-10-CM | POA: Diagnosis not present

## 2018-09-03 DIAGNOSIS — K611 Rectal abscess: Secondary | ICD-10-CM | POA: Diagnosis not present

## 2018-09-03 DIAGNOSIS — Z79891 Long term (current) use of opiate analgesic: Secondary | ICD-10-CM | POA: Diagnosis not present

## 2018-09-03 DIAGNOSIS — Z79899 Other long term (current) drug therapy: Secondary | ICD-10-CM | POA: Diagnosis not present

## 2018-09-03 DIAGNOSIS — G894 Chronic pain syndrome: Secondary | ICD-10-CM | POA: Diagnosis not present

## 2018-09-03 DIAGNOSIS — M545 Low back pain: Secondary | ICD-10-CM | POA: Diagnosis not present

## 2018-09-04 DIAGNOSIS — K603 Anal fistula: Secondary | ICD-10-CM | POA: Diagnosis not present

## 2018-09-09 DIAGNOSIS — K603 Anal fistula: Secondary | ICD-10-CM | POA: Diagnosis not present

## 2018-10-01 DIAGNOSIS — Z79899 Other long term (current) drug therapy: Secondary | ICD-10-CM | POA: Diagnosis not present

## 2018-10-01 DIAGNOSIS — K611 Rectal abscess: Secondary | ICD-10-CM | POA: Diagnosis not present

## 2018-10-01 DIAGNOSIS — Z79891 Long term (current) use of opiate analgesic: Secondary | ICD-10-CM | POA: Diagnosis not present

## 2018-10-01 DIAGNOSIS — K6289 Other specified diseases of anus and rectum: Secondary | ICD-10-CM | POA: Diagnosis not present

## 2018-10-01 DIAGNOSIS — G894 Chronic pain syndrome: Secondary | ICD-10-CM | POA: Diagnosis not present

## 2018-10-01 DIAGNOSIS — M961 Postlaminectomy syndrome, not elsewhere classified: Secondary | ICD-10-CM | POA: Diagnosis not present

## 2018-10-07 ENCOUNTER — Ambulatory Visit: Payer: Self-pay | Admitting: Surgery

## 2018-10-07 NOTE — H&P (Signed)
CC: Postop f/u - operated on by my partner, Dr. Marlou Starks  HPI: Mr. Currier is a very pleasant 48yoM with hx of HTN, GERD who was taken to the operating room on 05/29/2018 by my partner, Dr. Marlou Starks, for incision and drainage of a perirectal abscess. He was found to have a anal fistula cyst seen with this with a large hole on the inner portion of the anal canal. This was subsequently controlled with a quarter inch Penrose drain. He was discharged home on antibiotics and returns to see me for follow-up. He denies any recurrent fever/chills. He reports ongoing discomfort related to this drain. He still has some drainage around it. He denies history of incontinence to gas/liquid/solid stool.  He was seen 4/15 for recheck and down-sizing of seton. Reported steady pain with the seton but that it is different than the pain he had when he had an abscess. This he described as intermittent and sharp/shooting like electical in the anal area.  CT pelvis 07/10/2018 - showed no evidence of abscess - does have small pocket of air just proximal to seton which is likely the collapsing cavity he had.  INTERVAL HX He is here today for f/u. He underwent colonoscopy with Dr. Silverio Decamp 07/28/2018 - - Three 5 to 9 mm polyps in the rectum, in the sigmoid colon and in the transverse colon, removed with a cold snare. Resected and retrieved. - One 11 mm polyp in the sigmoid colon, removed with a hot snare. Resected and retrieved. - Mild diverticulosis in the sigmoid colon and in the descending colon. - Normal mucosa in the entire examined colon. Biopsied. - Non-bleeding internal hemorrhoids. - The examination was otherwise normal.  He was seen in our urgent office last week for a "knot" and developed near his external opening and pain related to this. He was prescribed Flagyl but not found to have definitive evidence of ongoing abscess in the office. He reports his symptoms have dramatically improved. At this time, he denies  any significant perianal pain. He denies any fever/chills. He does have ongoing small volume drainage from his fistula. Doing well today  PMH: HTN (well contorlled on oral antihypertensive), GERD (well controlled on PPI)  PSH: I&D of perirectal abscess; he denies any other previous anorectal procedures  FHx: Denies FHx of malignancy  Social: Denies use of tobacco/EtOH/drugs  ROS: A comprehensive 10 system review of systems was completed with the patient and pertinent findings as noted above.  The patient is a 48 year old male.   Allergies (Tanisha A. Owens Shark, [place]; 09/09/2018 9:44 AM) No Known Allergies  [09/04/2018]: No Known Drug Allergies  [06/17/2018]: Allergies Reconciled   Medication History (Tanisha A. Owens Shark, RMA; 09/09/2018 9:44 AM) oxyCODONE HCl (5MG  Tablet, 1 (one) Oral every six hours, as needed, Taken starting 07/10/2018) Active. metroNIDAZOLE (500MG  Tablet, 1 (one) Oral three times daily, Taken starting 09/04/2018) Active. NexIUM (2.5MG  Packet, Oral) Active. Medications Reconciled    Review of Systems Harrell Gave M. Nur Rabold MD; 09/09/2018 10:16 AM) General Not Present- Appetite Loss, Chills, Fatigue, Fever, Night Sweats, Weight Gain and Weight Loss. Skin Not Present- Coarse Hair and Cracked Lips. HEENT Not Present- Earache, Hearing Loss, Hoarseness, Nose Bleed, Oral Ulcers, Ringing in the Ears, Seasonal Allergies, Sinus Pain, Sore Throat, Visual Disturbances, Wears glasses/contact lenses and Yellow Eyes. Respiratory Not Present- Cough and Decreased Exercise Tolerance. Cardiovascular Not Present- Calf Cramps and Chest Pain. Gastrointestinal Present- Rectal Pain. Not Present- Abdominal Pain, Bloating, Bloody Stool, Change in Bowel Habits, Chronic diarrhea, Constipation, Difficulty  Swallowing, Excessive gas, Gets full quickly at meals, Hemorrhoids, Indigestion, Nausea and Vomiting. Neurological Not Present- Decreased Memory, Fainting, Headaches, Numbness, Seizures,  Tingling, Tremor, Trouble walking and Weakness. Psychiatric Present- Anxiety and Depression. Endocrine Not Present- Appetite Changes and Cold Intolerance. Hematology Not Present- Abnormal Bleeding, Blood Clots and Blood Thinners.  Vitals (Tanisha A. Brown RMA; 09/09/2018 9:44 AM) 09/09/2018 9:43 AM Weight: 198 lb Height: 75in Body Surface Area: 2.19 m Body Mass Index: 24.75 kg/m  Temp.: 98.25F  Pulse: 105 (Regular)  BP: 144/82(Sitting, Left Arm, Standard)       Physical Exam Harrell Gave M. Jalil Lorusso MD; 09/09/2018 10:21 AM) The physical exam findings are as follows: Note: Constitutional: No acute distress; conversant; no deformities Eyes: Moist conjunctiva; no lid lag; anicteric sclerae; pupils equal round and reactive to light Neck: Trachea midline; no palpable thyromegaly Lungs: Normal respiratory effort; no tactile fremitus CV: Regular rate and rhythm; no palpable thrill; no pitting edema GI: Abdomen soft, nontender, nondistended; no palpable hepatosplenomegaly Anorectal: Blue vessel loop in place. No surrounding erythema. No palpable perianal fluctuance on either side or in anal canal. Does have scant purulent drainage. Anoscopy: Raymar Joiner scar in the anal canal around his internal opening consistent with prior significant opening at this location. Remainder of anal canal difficult to visualize due to poor tolerance. MSK: Normal gait; no clubbing/cyanosis Psychiatric: Appropriate affect; alert and oriented 3 Lymphatic: No palpable cervical or axillary lymphadenopathy    Assessment & Plan Harrell Gave M. Shaily Librizzi MD; 09/09/2018 10:22 AM)  ANAL FISTULA (K60.3) Story: Mr. Leverich is a very pleasant 48yoM with hx of HTN, GERD whom was taken to OR 05/29/2018 for I&D of perirectal abscess - found to have fistula and this was controlled with Penrose drain by Dr. Marlou Starks - underwent down-sizing to blue vessel loop 4/15 - CT pelvis 4/23 - no evidence of abscess Impression: -The  anatomy and physiology of the anal canal was discussed at length with the patient. The pathophysiology of anal abscesses and fistula was discussed at length again today with associated pictures and illustrations. -We discussed options moving forward. We discussed long-term seton management for this condition but the downside being the seton was left in place indefinitely. We also discussed anorectal exam under anesthesia, possible fistulotomy, possible ligation of intersphincteric fistulous tract, other indicated procedures-all based on intraoperative findings. We discussed if the tract does not involve a significant amount of sphincter muscle, the fistulotomy being a potential option. If it involves more muscle, doing a procedure such as a LIFT. -The planned procedures, material risks (including, but not limited to, pain, bleeding, infection, scarring, need for blood transfusion, damage to anal sphincter, incontinence of gas and/or stool, need for additional procedures, recurrence rates of up to 30-40% for trans-sphincteric fistulas, pneumonia, heart attack, stroke, death) benefits and alternatives to surgery were discussed at length. I noted a good probability that the procedure would help improve their symptoms but reiterated potential for recurrences. The patient's questions were welcomed and answered to his satisfaction, he voiced understanding and elected to proceed with surgery. Additionally, we discussed typical postoperative expectations and the recovery process. -Will plan no sooner than 1 month from now given recent phlegmon/abscess that spontaneously drained  Current Plans ANOSCOPY, DIAGNOSTIC (51884) (Anoscopy: Catalyna Reilly scar in the anal canal around his internal opening consistent with prior significant opening at this location. Remainder of anal canal difficult to visualize due to poor tolerance.) Signed electronically by Ileana Roup, MD (09/09/2018 10:22 AM)

## 2018-11-04 ENCOUNTER — Other Ambulatory Visit: Payer: Self-pay

## 2018-11-04 ENCOUNTER — Encounter (HOSPITAL_COMMUNITY)
Admission: RE | Admit: 2018-11-04 | Discharge: 2018-11-04 | Disposition: A | Discharge status: Home / Self Care | Payer: BC Managed Care – PPO | Source: Ambulatory Visit

## 2018-11-04 ENCOUNTER — Other Ambulatory Visit (HOSPITAL_COMMUNITY)
Admission: RE | Admit: 2018-11-04 | Discharge: 2018-11-04 | Disposition: A | Discharge status: Home / Self Care | Payer: BC Managed Care – PPO | Source: Ambulatory Visit | Attending: Surgery | Admitting: Surgery

## 2018-11-04 DIAGNOSIS — K603 Anal fistula: Secondary | ICD-10-CM | POA: Insufficient documentation

## 2018-11-04 DIAGNOSIS — Z01812 Encounter for preprocedural laboratory examination: Secondary | ICD-10-CM | POA: Diagnosis not present

## 2018-11-04 DIAGNOSIS — Z20828 Contact with and (suspected) exposure to other viral communicable diseases: Secondary | ICD-10-CM | POA: Insufficient documentation

## 2018-11-04 LAB — CBC WITH DIFFERENTIAL/PLATELET
Abs Immature Granulocytes: 0.06 10*3/uL (ref 0.00–0.07)
Basophils Absolute: 0.1 10*3/uL (ref 0.0–0.1)
Basophils Relative: 1 %
Eosinophils Absolute: 0.1 10*3/uL (ref 0.0–0.5)
Eosinophils Relative: 1 %
HCT: 40.8 % (ref 39.0–52.0)
Hemoglobin: 12.4 g/dL — ABNORMAL LOW (ref 13.0–17.0)
Immature Granulocytes: 0 %
Lymphocytes Relative: 13 %
Lymphs Abs: 2 10*3/uL (ref 0.7–4.0)
MCH: 25.3 pg — ABNORMAL LOW (ref 26.0–34.0)
MCHC: 30.4 g/dL (ref 30.0–36.0)
MCV: 83.1 fL (ref 80.0–100.0)
Monocytes Absolute: 1.4 10*3/uL — ABNORMAL HIGH (ref 0.1–1.0)
Monocytes Relative: 9 %
Neutro Abs: 11.8 10*3/uL — ABNORMAL HIGH (ref 1.7–7.7)
Neutrophils Relative %: 76 %
Platelets: 308 10*3/uL (ref 150–400)
RBC: 4.91 MIL/uL (ref 4.22–5.81)
RDW: 18.7 % — ABNORMAL HIGH (ref 11.5–15.5)
WBC: 15.4 10*3/uL — ABNORMAL HIGH (ref 4.0–10.5)
nRBC: 0 % (ref 0.0–0.2)

## 2018-11-04 LAB — COMPREHENSIVE METABOLIC PANEL
ALT: 22 U/L (ref 0–44)
AST: 19 U/L (ref 15–41)
Albumin: 4.5 g/dL (ref 3.5–5.0)
Alkaline Phosphatase: 67 U/L (ref 38–126)
Anion gap: 9 (ref 5–15)
BUN: 19 mg/dL (ref 6–20)
CO2: 27 mmol/L (ref 22–32)
Calcium: 9.9 mg/dL (ref 8.9–10.3)
Chloride: 102 mmol/L (ref 98–111)
Creatinine, Ser: 0.72 mg/dL (ref 0.61–1.24)
GFR calc Af Amer: >60 mL/min (ref >60)
GFR calc non Af Amer: >60 mL/min (ref >60)
Glucose, Bld: 112 mg/dL — ABNORMAL HIGH (ref 70–99)
Potassium: 4.3 mmol/L (ref 3.5–5.1)
Sodium: 138 mmol/L (ref 135–145)
Total Bilirubin: 0.9 mg/dL (ref 0.3–1.2)
Total Protein: 8.7 g/dL — ABNORMAL HIGH (ref 6.5–8.1)

## 2018-11-04 LAB — PROTIME-INR
INR: 1 (ref 0.8–1.2)
Prothrombin Time: 12.9 seconds (ref 11.4–15.2)

## 2018-11-04 LAB — SARS CORONAVIRUS 2 (TAT 6-24 HRS): SARS Coronavirus 2: NEGATIVE

## 2018-11-04 LAB — APTT: aPTT: 26 seconds (ref 24–36)

## 2018-11-06 ENCOUNTER — Encounter (HOSPITAL_BASED_OUTPATIENT_CLINIC_OR_DEPARTMENT_OTHER): Payer: Self-pay | Admitting: *Deleted

## 2018-11-06 ENCOUNTER — Other Ambulatory Visit: Payer: Self-pay

## 2018-11-06 NOTE — Anesthesia Preprocedure Evaluation (Addendum)
Anesthesia Evaluation  Patient identified by MRN, date of birth, ID band Patient awake    Reviewed: Allergy & Precautions, H&P , NPO status , Patient's Chart, lab work & pertinent test results  Airway Mallampati: II  TM Distance: >3 FB Neck ROM: Full    Dental no notable dental hx. (+) Teeth Intact   Pulmonary Current Smoker,    Pulmonary exam normal breath sounds clear to auscultation       Cardiovascular Exercise Tolerance: Good hypertension, Pt. on medications Normal cardiovascular exam Rhythm:Regular Rate:Normal     Neuro/Psych PSYCHIATRIC DISORDERS Depression    GI/Hepatic Neg liver ROS, GERD  Medicated,  Endo/Other  negative endocrine ROS  Renal/GU negative Renal ROS     Musculoskeletal  (+) Arthritis ,   Abdominal   Peds  Hematology negative hematology ROS (+)   Anesthesia Other Findings   Reproductive/Obstetrics                            Anesthesia Physical Anesthesia Plan  ASA: III  Anesthesia Plan: General   Post-op Pain Management:    Induction: Intravenous  PONV Risk Score and Plan: 2 and Treatment may vary due to age or medical condition, Ondansetron, Dexamethasone and Midazolam  Airway Management Planned: Oral ETT  Additional Equipment:   Intra-op Plan:   Post-operative Plan: Extubation in OR  Informed Consent: I have reviewed the patients History and Physical, chart, labs and discussed the procedure including the risks, benefits and alternatives for the proposed anesthesia with the patient or authorized representative who has indicated his/her understanding and acceptance.     Dental advisory given  Plan Discussed with:   Anesthesia Plan Comments:       Anesthesia Quick Evaluation

## 2018-11-06 NOTE — Progress Notes (Signed)
Spoke w/ pt via phone for pre-op interview.  Npo after mn.  Arrive at 0530.  Current lab results ,covid test and ekg dated 11-04-2018 in chart and epic.  Pt verbalized understanding to do one fleet enema tonight and one fleet enema am dos.

## 2018-11-07 ENCOUNTER — Other Ambulatory Visit: Payer: Self-pay

## 2018-11-07 ENCOUNTER — Ambulatory Visit (HOSPITAL_BASED_OUTPATIENT_CLINIC_OR_DEPARTMENT_OTHER): Payer: BC Managed Care – PPO | Admitting: Anesthesiology

## 2018-11-07 ENCOUNTER — Encounter (HOSPITAL_BASED_OUTPATIENT_CLINIC_OR_DEPARTMENT_OTHER)
Admission: RE | Disposition: A | Discharge status: Home / Self Care | Payer: Self-pay | Source: Home / Self Care | Attending: Surgery

## 2018-11-07 ENCOUNTER — Ambulatory Visit (HOSPITAL_BASED_OUTPATIENT_CLINIC_OR_DEPARTMENT_OTHER)
Admission: RE | Admit: 2018-11-07 | Discharge: 2018-11-07 | Disposition: A | Discharge status: Home / Self Care | Payer: BC Managed Care – PPO | Attending: Surgery | Admitting: Surgery

## 2018-11-07 ENCOUNTER — Encounter (HOSPITAL_BASED_OUTPATIENT_CLINIC_OR_DEPARTMENT_OTHER): Payer: Self-pay | Admitting: *Deleted

## 2018-11-07 DIAGNOSIS — I10 Essential (primary) hypertension: Secondary | ICD-10-CM | POA: Insufficient documentation

## 2018-11-07 DIAGNOSIS — K603 Anal fistula: Secondary | ICD-10-CM | POA: Insufficient documentation

## 2018-11-07 DIAGNOSIS — Z79899 Other long term (current) drug therapy: Secondary | ICD-10-CM | POA: Diagnosis not present

## 2018-11-07 DIAGNOSIS — K219 Gastro-esophageal reflux disease without esophagitis: Secondary | ICD-10-CM | POA: Insufficient documentation

## 2018-11-07 DIAGNOSIS — M1990 Unspecified osteoarthritis, unspecified site: Secondary | ICD-10-CM | POA: Diagnosis not present

## 2018-11-07 DIAGNOSIS — F17210 Nicotine dependence, cigarettes, uncomplicated: Secondary | ICD-10-CM | POA: Insufficient documentation

## 2018-11-07 DIAGNOSIS — F329 Major depressive disorder, single episode, unspecified: Secondary | ICD-10-CM | POA: Diagnosis not present

## 2018-11-07 HISTORY — DX: Anal fistula: K60.3

## 2018-11-07 HISTORY — PX: FISTULOTOMY: SHX6413

## 2018-11-07 HISTORY — DX: Anal fistula, unspecified: K60.30

## 2018-11-07 SURGERY — EXAM UNDER ANESTHESIA
Anesthesia: General | Site: Rectum

## 2018-11-07 MED ORDER — BUPIVACAINE LIPOSOME 1.3 % IJ SUSP
INTRAMUSCULAR | Status: DC | PRN
  Administered 2018-11-07: 20 mL

## 2018-11-07 MED ORDER — HYDROMORPHONE HCL 1 MG/ML IJ SOLN
INTRAMUSCULAR | Status: AC
  Filled 2018-11-07: qty 1

## 2018-11-07 MED ORDER — DEXAMETHASONE SODIUM PHOSPHATE 10 MG/ML IJ SOLN
INTRAMUSCULAR | Status: AC
  Filled 2018-11-07: qty 1

## 2018-11-07 MED ORDER — GABAPENTIN 300 MG PO CAPS
ORAL_CAPSULE | ORAL | Status: AC
  Filled 2018-11-07: qty 1

## 2018-11-07 MED ORDER — GABAPENTIN 300 MG PO CAPS
300.0000 mg | ORAL_CAPSULE | ORAL | Status: AC
  Administered 2018-11-07: 300 mg via ORAL
  Filled 2018-11-07: qty 1

## 2018-11-07 MED ORDER — HYDROCODONE-ACETAMINOPHEN 7.5-325 MG PO TABS
ORAL_TABLET | ORAL | Status: AC
  Filled 2018-11-07: qty 1

## 2018-11-07 MED ORDER — CHLORHEXIDINE GLUCONATE CLOTH 2 % EX PADS
6.0000 | MEDICATED_PAD | Freq: Once | CUTANEOUS | Status: DC
  Filled 2018-11-07: qty 6

## 2018-11-07 MED ORDER — FENTANYL CITRATE (PF) 100 MCG/2ML IJ SOLN
INTRAMUSCULAR | Status: AC
  Filled 2018-11-07: qty 2

## 2018-11-07 MED ORDER — ONDANSETRON HCL 4 MG/2ML IJ SOLN
INTRAMUSCULAR | Status: AC
  Filled 2018-11-07: qty 2

## 2018-11-07 MED ORDER — LIDOCAINE 2% (20 MG/ML) 5 ML SYRINGE
INTRAMUSCULAR | Status: DC | PRN
  Administered 2018-11-07: 100 mg via INTRAVENOUS

## 2018-11-07 MED ORDER — PROPOFOL 10 MG/ML IV BOLUS
INTRAVENOUS | Status: AC
  Filled 2018-11-07: qty 40

## 2018-11-07 MED ORDER — OXYCODONE HCL 5 MG PO TABS: 10.0000 mg | ORAL_TABLET | Freq: Four times a day (QID) | ORAL | 0 refills | Status: AC | PRN

## 2018-11-07 MED ORDER — MEPERIDINE HCL 25 MG/ML IJ SOLN
6.2500 mg | INTRAMUSCULAR | Status: DC | PRN
  Filled 2018-11-07: qty 1

## 2018-11-07 MED ORDER — BUPIVACAINE-EPINEPHRINE 0.25% -1:200000 IJ SOLN
INTRAMUSCULAR | Status: DC | PRN
  Administered 2018-11-07: 30 mL

## 2018-11-07 MED ORDER — MIDAZOLAM HCL 2 MG/2ML IJ SOLN
INTRAMUSCULAR | Status: AC
  Filled 2018-11-07: qty 2

## 2018-11-07 MED ORDER — PROMETHAZINE HCL 25 MG/ML IJ SOLN
6.2500 mg | INTRAMUSCULAR | Status: DC | PRN
  Filled 2018-11-07: qty 1

## 2018-11-07 MED ORDER — FENTANYL CITRATE (PF) 250 MCG/5ML IJ SOLN
INTRAMUSCULAR | Status: AC
  Filled 2018-11-07: qty 5

## 2018-11-07 MED ORDER — BUPIVACAINE LIPOSOME 1.3 % IJ SUSP
INTRAMUSCULAR | Status: AC
  Filled 2018-11-07: qty 20

## 2018-11-07 MED ORDER — LIDOCAINE 2% (20 MG/ML) 5 ML SYRINGE
INTRAMUSCULAR | Status: AC
  Filled 2018-11-07: qty 5

## 2018-11-07 MED ORDER — PROPOFOL 10 MG/ML IV BOLUS
INTRAVENOUS | Status: DC | PRN
  Administered 2018-11-07: 200 mg via INTRAVENOUS

## 2018-11-07 MED ORDER — DEXAMETHASONE SODIUM PHOSPHATE 10 MG/ML IJ SOLN
INTRAMUSCULAR | Status: DC | PRN
  Administered 2018-11-07: 5 mg via INTRAVENOUS

## 2018-11-07 MED ORDER — ACETAMINOPHEN 500 MG PO TABS
1000.0000 mg | ORAL_TABLET | ORAL | Status: AC
  Administered 2018-11-07: 1000 mg via ORAL
  Filled 2018-11-07: qty 2

## 2018-11-07 MED ORDER — HYDROCODONE-ACETAMINOPHEN 7.5-325 MG PO TABS
1.0000 | ORAL_TABLET | Freq: Once | ORAL | Status: AC | PRN
  Administered 2018-11-07: 1 via ORAL
  Filled 2018-11-07: qty 1

## 2018-11-07 MED ORDER — HYDROMORPHONE HCL 1 MG/ML IJ SOLN
0.2500 mg | INTRAMUSCULAR | Status: DC | PRN
  Filled 2018-11-07: qty 0.5

## 2018-11-07 MED ORDER — ACETAMINOPHEN 500 MG PO TABS
ORAL_TABLET | ORAL | Status: AC
  Filled 2018-11-07: qty 2

## 2018-11-07 MED ORDER — ACETAMINOPHEN 10 MG/ML IV SOLN
1000.0000 mg | Freq: Once | INTRAVENOUS | Status: DC | PRN
  Filled 2018-11-07: qty 100

## 2018-11-07 MED ORDER — HYDROMORPHONE HCL 1 MG/ML IJ SOLN
0.2500 mg | INTRAMUSCULAR | Status: DC | PRN
  Administered 2018-11-07: 0.5 mg via INTRAVENOUS
  Filled 2018-11-07: qty 0.5

## 2018-11-07 MED ORDER — GABAPENTIN 300 MG PO CAPS: 300.0000 mg | ORAL_CAPSULE | Freq: Two times a day (BID) | ORAL | 0 refills | Status: DC

## 2018-11-07 MED ORDER — ONDANSETRON HCL 4 MG/2ML IJ SOLN
INTRAMUSCULAR | Status: DC | PRN
  Administered 2018-11-07: 4 mg via INTRAVENOUS

## 2018-11-07 MED ORDER — HYDROMORPHONE HCL 1 MG/ML IJ SOLN
0.2500 mg | INTRAMUSCULAR | Status: DC | PRN
  Administered 2018-11-07 (×4): 0.5 mg via INTRAVENOUS
  Filled 2018-11-07: qty 0.5

## 2018-11-07 MED ORDER — MIDAZOLAM HCL 2 MG/2ML IJ SOLN
INTRAMUSCULAR | Status: DC | PRN
  Administered 2018-11-07: 2 mg via INTRAVENOUS

## 2018-11-07 MED ORDER — ROCURONIUM BROMIDE 10 MG/ML (PF) SYRINGE
PREFILLED_SYRINGE | INTRAVENOUS | Status: DC | PRN
  Administered 2018-11-07: 60 mg via INTRAVENOUS

## 2018-11-07 MED ORDER — BUPIVACAINE LIPOSOME 1.3 % IJ SUSP
20.0000 mL | Freq: Once | INTRAMUSCULAR | Status: DC
  Filled 2018-11-07: qty 20

## 2018-11-07 MED ORDER — DIBUCAINE (PERIANAL) 1 % EX OINT
TOPICAL_OINTMENT | CUTANEOUS | Status: DC | PRN
  Administered 2018-11-07: 1 via RECTAL

## 2018-11-07 MED ORDER — KETOROLAC TROMETHAMINE 30 MG/ML IJ SOLN
30.0000 mg | Freq: Once | INTRAMUSCULAR | Status: AC
  Administered 2018-11-07: 30 mg via INTRAVENOUS
  Filled 2018-11-07: qty 1

## 2018-11-07 MED ORDER — LACTATED RINGERS IV SOLN
INTRAVENOUS | Status: DC
  Administered 2018-11-07 (×3): via INTRAVENOUS
  Filled 2018-11-07: qty 1000

## 2018-11-07 MED ORDER — SUGAMMADEX SODIUM 200 MG/2ML IV SOLN
INTRAVENOUS | Status: DC | PRN
  Administered 2018-11-07: 180 mg via INTRAVENOUS

## 2018-11-07 MED ORDER — KETOROLAC TROMETHAMINE 30 MG/ML IJ SOLN
INTRAMUSCULAR | Status: AC
  Filled 2018-11-07: qty 1

## 2018-11-07 MED ORDER — FENTANYL CITRATE (PF) 100 MCG/2ML IJ SOLN
INTRAMUSCULAR | Status: DC | PRN
  Administered 2018-11-07 (×5): 50 ug via INTRAVENOUS

## 2018-11-07 SURGICAL SUPPLY — 61 items
BENZOIN TINCTURE PRP APPL 2/3 (GAUZE/BANDAGES/DRESSINGS) ×4 IMPLANT
BLADE EXTENDED COATED 6.5IN (ELECTRODE) IMPLANT
BLADE HEX COATED 2.75 (ELECTRODE) ×4 IMPLANT
BLADE SURG 10 STRL SS (BLADE) IMPLANT
BLADE SURG 15 STRL LF DISP TIS (BLADE) ×2 IMPLANT
BLADE SURG 15 STRL SS (BLADE) ×2
BRIEF STRETCH FOR OB PAD LRG (UNDERPADS AND DIAPERS) ×8 IMPLANT
CANISTER SUCT 3000ML PPV (MISCELLANEOUS) ×4 IMPLANT
COVER BACK TABLE 60X90IN (DRAPES) ×4 IMPLANT
COVER MAYO STAND STRL (DRAPES) ×4 IMPLANT
COVER WAND RF STERILE (DRAPES) ×4 IMPLANT
DECANTER SPIKE VIAL GLASS SM (MISCELLANEOUS) ×4 IMPLANT
DRAPE LAPAROTOMY 100X72 PEDS (DRAPES) ×4 IMPLANT
DRAPE UTILITY XL STRL (DRAPES) ×4 IMPLANT
GAUZE SPONGE 4X4 12PLY STRL (GAUZE/BANDAGES/DRESSINGS) ×4 IMPLANT
GLOVE BIO SURGEON STRL SZ7.5 (GLOVE) ×4 IMPLANT
GLOVE INDICATOR 8.0 STRL GRN (GLOVE) ×4 IMPLANT
GOWN STRL REUS W/TWL LRG LVL3 (GOWN DISPOSABLE) ×4 IMPLANT
HYDROGEN PEROXIDE 16OZ (MISCELLANEOUS) IMPLANT
IV CATH 14GX2 1/4 (CATHETERS) IMPLANT
IV CATH 18G SAFETY (IV SOLUTION) IMPLANT
KIT SIGMOIDOSCOPE (SET/KITS/TRAYS/PACK) IMPLANT
KIT TURNOVER CYSTO (KITS) ×4 IMPLANT
LOOP VESSEL MAXI BLUE (MISCELLANEOUS) IMPLANT
NDL SAFETY ECLIPSE 18X1.5 (NEEDLE) IMPLANT
NEEDLE HYPO 18GX1.5 SHARP (NEEDLE)
NEEDLE HYPO 22GX1.5 SAFETY (NEEDLE) ×4 IMPLANT
NS IRRIG 500ML POUR BTL (IV SOLUTION) ×4 IMPLANT
PACK BASIN DAY SURGERY FS (CUSTOM PROCEDURE TRAY) ×4 IMPLANT
PAD ABD 8X10 STRL (GAUZE/BANDAGES/DRESSINGS) ×4 IMPLANT
PAD ARMBOARD 7.5X6 YLW CONV (MISCELLANEOUS) IMPLANT
PENCIL BUTTON HOLSTER BLD 10FT (ELECTRODE) ×4 IMPLANT
RETRACTOR STAY HOOK 5MM (MISCELLANEOUS) ×4 IMPLANT
RETRACTOR STERILE 25.8CMX11.3 (INSTRUMENTS) ×4 IMPLANT
SPONGE HEMORRHOID 8X3CM (HEMOSTASIS) ×4 IMPLANT
SPONGE SURGIFOAM ABS GEL 12-7 (HEMOSTASIS) IMPLANT
SUCTION FRAZIER HANDLE 10FR (MISCELLANEOUS)
SUCTION TUBE FRAZIER 10FR DISP (MISCELLANEOUS) IMPLANT
SUT CHROMIC 2 0 SH (SUTURE) IMPLANT
SUT CHROMIC 3 0 SH 27 (SUTURE) IMPLANT
SUT MNCRL AB 4-0 PS2 18 (SUTURE) IMPLANT
SUT SILK 0 PSL NDL (SUTURE) IMPLANT
SUT SILK 0 TIES 10X30 (SUTURE) ×4 IMPLANT
SUT SILK 2 0 (SUTURE) ×2
SUT SILK 2-0 18XBRD TIE 12 (SUTURE) ×2 IMPLANT
SUT VIC AB 2-0 SH 27 (SUTURE) ×2
SUT VIC AB 2-0 SH 27XBRD (SUTURE) ×2 IMPLANT
SUT VIC AB 3-0 SH 18 (SUTURE) IMPLANT
SUT VIC AB 3-0 SH 27 (SUTURE)
SUT VIC AB 3-0 SH 27X BRD (SUTURE) IMPLANT
SUT VIC AB 3-0 SH 27XBRD (SUTURE) IMPLANT
SUT VIC AB 4-0 P-3 18XBRD (SUTURE) IMPLANT
SUT VIC AB 4-0 P3 18 (SUTURE)
SYR 20CC LL (SYRINGE) IMPLANT
SYR BULB IRRIGATION 50ML (SYRINGE) ×4 IMPLANT
SYR CONTROL 10ML LL (SYRINGE) ×4 IMPLANT
TOWEL OR 17X26 10 PK STRL BLUE (TOWEL DISPOSABLE) ×4 IMPLANT
TRAY DSU PREP LF (CUSTOM PROCEDURE TRAY) ×4 IMPLANT
TUBE CONNECTING 12'X1/4 (SUCTIONS) ×1
TUBE CONNECTING 12X1/4 (SUCTIONS) ×3 IMPLANT
YANKAUER SUCT BULB TIP NO VENT (SUCTIONS) ×4 IMPLANT

## 2018-11-07 NOTE — Transfer of Care (Signed)
Immediate Anesthesia Transfer of Care Note  Patient: Carlos Daugherty  Procedure(s) Performed: Procedure(s) (LRB): ANORECTAL EXAM UNDER ANESTHESIA (N/A) PARTIAL FISTULOTOMY WITH CUTTING SETON PLACEMENT (N/A)  Patient Location: PACU  Anesthesia Type: General  Level of Consciousness: awake, oriented, sedated and patient cooperative  Airway & Oxygen Therapy: Patient Spontanous Breathing and Patient connected to face mask oxygen  Post-op Assessment: Report given to PACU RN and Post -op Vital signs reviewed and stable  Post vital signs: Reviewed and stable  Complications: No apparent anesthesia complications Last Vitals:  Vitals Value Taken Time  BP    Temp    Pulse    Resp    SpO2      Last Pain:  Vitals:   11/07/18 0540  TempSrc: Oral

## 2018-11-07 NOTE — Anesthesia Procedure Notes (Signed)
Procedure Name: Intubation Date/Time: 11/07/2018 7:38 AM Performed by: Suan Halter, CRNA Pre-anesthesia Checklist: Patient identified, Emergency Drugs available, Suction available and Patient being monitored Patient Re-evaluated:Patient Re-evaluated prior to induction Oxygen Delivery Method: Circle system utilized Preoxygenation: Pre-oxygenation with 100% oxygen Induction Type: IV induction Ventilation: Mask ventilation without difficulty Laryngoscope Size: Mac and 3 Grade View: Grade I Tube type: Oral Tube size: 7.5 mm Number of attempts: 1 Airway Equipment and Method: Stylet and Oral airway Placement Confirmation: ETT inserted through vocal cords under direct vision,  positive ETCO2 and breath sounds checked- equal and bilateral Secured at: 23 cm Tube secured with: Tape Dental Injury: Teeth and Oropharynx as per pre-operative assessment

## 2018-11-07 NOTE — Anesthesia Postprocedure Evaluation (Signed)
Anesthesia Post Note  Patient: Carlos Daugherty  Procedure(s) Performed: ANORECTAL EXAM UNDER ANESTHESIA (N/A Rectum) PARTIAL FISTULOTOMY WITH CUTTING SETON PLACEMENT (N/A )     Patient location during evaluation: PACU Anesthesia Type: General Level of consciousness: awake and alert Pain management: pain level controlled Vital Signs Assessment: post-procedure vital signs reviewed and stable Respiratory status: spontaneous breathing, nonlabored ventilation, respiratory function stable and patient connected to nasal cannula oxygen Cardiovascular status: blood pressure returned to baseline and stable Postop Assessment: no apparent nausea or vomiting Anesthetic complications: no    Last Vitals:  Vitals:   11/07/18 0945 11/07/18 1000  BP: 135/81 120/87  Pulse: 76 72  Resp: 11 15  Temp:    SpO2: 97% 97%    Last Pain:  Vitals:   11/07/18 0945  TempSrc:   PainSc: 7                  Barnet Glasgow

## 2018-11-07 NOTE — H&P (Signed)
CC: Here today for surgery  HPI: Carlos Daugherty is a very pleasant 48yoM with hx of HTN, GERD who was taken to the operating room on 05/29/2018 by my partner, Dr. Marlou Starks, for incision and drainage of a perirectal abscess. He was found to have a anal fistula cyst seen with this with a large hole on the inner portion of the anal canal. This was subsequently controlled with a quarter inch Penrose drain. He was discharged home on antibiotics and returns to see me for follow-up. He denies any recurrent fever/chills. He reports ongoing discomfort related to this drain. He still has some drainage around it. He denies history of incontinence to gas/liquid/solid stool.  He was seen 4/15 for recheck and down-sizing of seton. Reported steady pain with the seton but that it is different than the pain he had when he had an abscess. This he described as intermittent and sharp/shooting like electical in the anal area.  CT pelvis 07/10/2018 - showed no evidence of abscess - does have small pocket of air just proximal to seton which is likely the collapsing cavity he had.  He was seen back for f/u. He underwent colonoscopy with Dr. Silverio Decamp 07/28/2018 - - Three 5 to 9 mm polyps in the rectum, in the sigmoid colon and in the transverse colon, removed with a cold snare. Resected and retrieved. - One 11 mm polyp in the sigmoid colon, removed with a hot snare. Resected and retrieved. - Mild diverticulosis in the sigmoid colon and in the descending colon. - Normal mucosa in the entire examined colon. Biopsied. - Non-bleeding internal hemorrhoids. - The examination was otherwise normal.  At this time, he denies any significant perianal pain. He denies any fever/chills. He does have ongoing small volume drainage from his fistula. Doing well today and ready for surgery.  PMH: HTN (well contorlled on oral antihypertensive), GERD (well controlled on PPI)  PSH: I&D of perirectal abscess; he denies any other  previous anorectal procedures  FHx: Denies FHx of malignancy  Social: Denies use of tobacco/EtOH/drugs  ROS: A comprehensive 10 system review of systems was completed with the patient and pertinent findings as noted above.  Past Medical History:  Diagnosis Date  . Anal fistula   . Anxiety   . Arthritis   . Depression   . GERD (gastroesophageal reflux disease)   . History of hypertension    per pt his pcp had him stopped bp medication March 2020, since wt loss    Past Surgical History:  Procedure Laterality Date  . COLONOSCOPY  07-28-2018   dr Silverio Decamp  . INCISION AND DRAINAGE PERIRECTAL ABSCESS N/A 05/29/2018   Procedure: IRRIGATION AND DEBRIDEMENT PERIRECTAL ABSCESS;  Surgeon: Jovita Kussmaul, MD;  Location: [facility];  Service: General;  Laterality: N/A;  . INGUINAL HERNIA REPAIR Bilateral age 59 and age 29  . MAXIMUM ACCESS (MAS)POSTERIOR LUMBAR INTERBODY FUSION (PLIF) 1 LEVEL N/A 09/27/2014   Procedure:  L4-L5 mas PLIF  ;  Surgeon: Earnie Larsson, MD;  Location: [facility];  Service: Neurosurgery;  Laterality: N/A;   L4-L5 mas PLIF    . NASAL SINUS SURGERY  2015    Family History  Problem Relation Age of Onset  . Leukemia Father   . Colon cancer Neg Hx   . Esophageal cancer Neg Hx   . Rectal cancer Neg Hx   . Stomach cancer Neg Hx     Social:  reports that he has been smoking cigarettes. He has a 30.00 pack-year smoking  history. He has never used smokeless tobacco. He reports current alcohol use. He reports current drug use. Drug: Marijuana.  Allergies: No Known Allergies  Medications: I have reviewed the patient's current medications.  No results found for this or any previous visit (from the past 48 hour(s)).  No results found.  ROS - all of the below systems have been reviewed with the patient and positives are indicated with bold text General: chills, fever or night sweats Eyes: blurry vision or double vision ENT: epistaxis or sore throat Allergy/Immunology:  itchy/watery eyes or nasal congestion Hematologic/Lymphatic: bleeding problems, blood clots or swollen lymph nodes Endocrine: temperature intolerance or unexpected weight changes Breast: new or changing breast lumps or nipple discharge Resp: cough, shortness of breath, or wheezing CV: chest pain or dyspnea on exertion GI: as per HPI GU: dysuria, trouble voiding, or hematuria MSK: joint pain or joint stiffness Neuro: TIA or stroke symptoms Derm: pruritus and skin lesion changes Psych: anxiety and depression  PE Blood pressure (!) 129/98, pulse 96, temperature 98.8 F (37.1 C), temperature source Oral, resp. rate 14, height 6\' 4"  (1.93 m), weight 89 kg, SpO2 100 %. Constitutional: NAD; conversant; no deformities; wearing surgical mask Eyes: Moist conjunctiva; no lid lag; anicteric; PERRL Neck: Trachea midline; no thyromegaly Lungs: Normal respiratory effort; no tactile fremitus CV: RRR; no palpable thrills; no pitting edema GI: Abd soft, NT/ND; no palpable hepatosplenomegaly MSK: Normal gait; no clubbing/cyanosis Psychiatric: Appropriate affect; alert and oriented x3 Lymphatic: No palpable cervical or axillary lymphadenopathy  No results found for this or any previous visit (from the past 48 hour(s)).  No results found.  Carlos Daugherty is a very pleasant 48yoM with hx of HTN, GERD whom was taken to OR 05/29/2018 for I&D of perirectal abscess - found to have fistula and this was controlled with Penrose drain by Dr. Marlou Starks - underwent down-sizing to blue vessel loop 4/15 - CT pelvis 4/23 - no evidence of abscess  -The anatomy and physiology of the anal canal was discussed at length with the patient. The pathophysiology of anal abscesses and fistula was discussed at length again today -We discussed long-term seton management for this condition but the downside being the seton was left in place indefinitely. We also discussed anorectal exam under anesthesia, possible fistulotomy, possible  ligation of intersphincteric fistulous tract, other indicated procedures-all based on intraoperative findings. We discussed if the tract does not involve a significant amount of sphincter muscle, the fistulotomy being a potential option. If it involves more muscle, doing a procedure such as a LIFT. -The planned procedures, material risks (including, but not limited to, pain, bleeding, infection, scarring, need for blood transfusion, damage to anal sphincter, incontinence of gas and/or stool, need for additional procedures, recurrence rates of up to 30-40% for trans-sphincteric fistulas, pneumonia, heart attack, stroke, death) benefits and alternatives to surgery were discussed at length. I noted a good probability that the procedure would help improve their symptoms but reiterated potential for recurrences. The patient's questions were welcomed and answered to his satisfaction, he voiced understanding and elected to proceed with surgery. Additionally, we discussed typical postoperative expectations and the recovery process.  Sharon Mt. Dema Severin, M.D. General and Colorectal Surgery [facility]

## 2018-11-07 NOTE — Op Note (Signed)
11/07/2018  8:42 AM  PATIENT:  Calla Kicks  48 y.o. male  Patient Care Team: Maurice Small, MD as PCP - General (Family Medicine)  PRE-OPERATIVE DIAGNOSIS:  Trans-sphincteric anal fistula  POST-OPERATIVE DIAGNOSIS:  Same  PROCEDURE:   1. Partial fistulotomy 2. Placement of cutting seton 3. Anorectal exam under anesthesia  SURGEON:  Surgeon(s): Ileana Roup, MD  ASSISTANT: OR staff   ANESTHESIA:   general  SPECIMEN:  No Specimen  DISPOSITION OF SPECIMEN:  N/A  COUNTS:  Sponge, needle, and instrument counts were reported correct x2 at conclusion.  EBL: 5 mL  Drains: None  PLAN OF CARE: Discharge to home after PACU  PATIENT DISPOSITION:  PACU - hemodynamically stable.  INDICATION: Mr. Mellish is a very pleasant 46yoM with hx of HTN, GERD who was taken to the operating room on 05/29/2018 by my partner, Dr. Marlou Starks, for incision and drainage of a perirectal abscess. He was found to have an anal fistula seen with this, with a large hole on the inner portion of the anal canal - appeared like the abscess actually blew out the wall of his anal canal. This was subsequently controlled with a quarter inch Penrose drain. He was discharged home on antibiotics and returned to see me for follow-up for ongoing management. His seton was down-sized to a blue vessel loop. He had significant perianal pain in his recovery that slowly resolved. The drainage decreased. He did have a repeat CT which confirmed no recurrent abscess. After having had a seton >3 mo, we discussed proceeding with additional procedures to attempt to treat his fistula. Please refer to notes elsewhere for details regarding this discussion.  OR FINDINGS: Large amount of scar tissue in posterior midline where the abscess had blown out the wall of the anal canal. No abnormal appearing tissue. No ulcerations or masses. Fistula tract was quite large with significant amount of scar tissue surrounding it. The intersphincteric  plane appeared to be obliterated by this in vicinity of the fistula. The inner portion was not amenable to an advancement flap given size of scar from prior blow out. This appeared to remain a low trans-sphincteric fistula. Given these findings, a cutting seton was therefore planned.  DESCRIPTION: The patient was identified in the preoperative holding area and taken to the OR where SCDs were placed, he underwent general anesthesia, and was then placed on the operating room table in the prone position.  The buttocks were gently taped apart.  Hair was clipped. He was then prepped and draped in usual sterile fashion.  A surgical timeout was performed indicating the correct patient, procedure, positioning.  A rectal block was performed using a dilute mixture of Exparel with Marcaine + epinephrine.    A well lubricated digital rectal exam was performed which demonstrated significant scar tissue in the posterior midline.  A Hill-Ferguson anoscope was into the anal canal and circumferential inspection carried out. This demonstrated a keyhole deformity of the posterior midline anal canal with large amount of scar tissue which had covered the prior anal canal perforation he had sustained from the abscess.  Given this amount of scar tissue, an endorectal advancement (or even anocutaneous) flap was not feasible. A fistula probe was placed into the fistula. The seton was cut and removed. The tract was palpated and appeared to remain a low trans-sphincteric fistula. The fistula tract/remnant scar tissue was quite large in diameter. The intersphincteric groove was obliterated overlying this tract due to its size with scarring extending ~1cm laterally in  each direction. This was also therefore felt not to be amenable to LIFT. Therefore, a cutting seton was planned. A new blue vessel loop seton was passed through the fistula. The anoderm was incised sharply overlying the fistula tract. A partial fistulotomy was performed  beginning at the external opening and extending toward the anal canal until muscle was identified. No further fistulotomy was performed. Hemostasis was achieved with electrocautery. The seton was then secured to itself snuggly but not too tight using 0 silk suture, creating a cutting type seton around the exposed muscle. The anal canal was reinspected and hemostasis verified. A piece of proctofoam coated in dibucaine ointment was placed in the anal canal. Additional dibucaine was applied. 4x4 guaze, ABD and mesh underwear were used as a dressing. He was transferred back onto a stretcher, awoken from anesthesia, extubated and transferred to a stretcher for transport back to PACU in satisfactory condition.  DISPOSITION: PACU in satisfactory condition.

## 2018-11-07 NOTE — Discharge Instructions (Addendum)
ANORECTAL SURGERY: POST OP INSTRUCTIONS  A partial fistulotomy with placement of a cutting seton was performed. The seton will eventually work its way out and the wound should eventually close  1. DIET: Follow a light bland diet the first 24 hours after arrival home, such as soup, liquids, crackers, etc.  Be sure to include lots of fluids daily.  Avoid fast food or heavy meals as your are more likely to get nauseated.  Eat a low fat diet the next few days after surgery.    2. Take your usually prescribed home medications unless otherwise directed.  3. PAIN CONTROL: a. It is helpful to take an over-the-counter pain medication regularly for the first few days/weeks.  Choose from the following that works best for you: i. Ibuprofen (Advil, etc) Three 200mg  tabs every 6 hours as needed. ii. Acetaminophen (Tylenol, etc) 500-650mg  every 6 hours as needed iii. NOTE: You may take both of these medications together - most patients find it most helpful when alternating between the two (i.e. Ibuprofen at 6am, tylenol at 9am, ibuprofen at 12pm ...) b. A  prescription for pain medication may have been prescribed for you at discharge.  Take your pain medication as prescribed.  c. Additionally, gabapentin has been prescribed to help with managing pain. This works differently than a narcotic. It may cause some drowsiness so don't operate heavy machinery/drive while taking narcotics or this medication. i. If you are having problems/concerns with the prescription medicine, please call us for further advice.  4. Avoid getting constipated.  Between the surgery and the pain medications, it is common to experience some constipation.  Increasing fluid intake (64oz of water per day) and taking a fiber supplement (such as Metamucil, Citrucel, FiberCon) 1-2 times a day regularly will usually help prevent this problem from occurring.  Take Miralax (over the counter) 1-2x/day while taking a narcotic pain medication. If no bowel  movement after 48hours, you may additionally take a laxative like a bottle of Milk of Magnesia which can be purchased over the counter. Avoid enemas if possible as these are often painful.   5. Watch out for diarrhea.  If you have many loose bowel movements, simplify your diet to bland foods.  Stop any stool softeners and decrease your fiber supplement. If this worsens or does not improve, please call us.  6. Wash / shower every day.  If you were discharged with a dressing, you may remove this the day after your surgery. You may shower normally, getting soap/water on your wound, particularly after bowel movements.  7. Soaking in a warm bath filled a couple inches ("Sitz bath") is a great way to clean the area after a bowel movement and many patients find it is a way to soothe the area.  8. ACTIVITIES as tolerated:   a. You may resume regular (light) daily activities beginning the next day--such as daily self-care, walking, climbing stairs--gradually increasing activities as tolerated.  If you can walk 30 minutes without difficulty, it is safe to try more intense activity such as jogging, treadmill, bicycling, low-impact aerobics, etc. b. Refrain from any heavy lifting or straining for the first 2 weeks after your procedure, particularly if your surgery was for hemorrhoids. c. Avoid activities that make your pain worse d. You may drive when you are no longer taking prescription pain medication, you can comfortably wear a seatbelt, and you can safely maneuver your car and apply brakes.  9. FOLLOW UP in our office a. Please call [facility] at [phone] to set up an appointment to see your surgeon in the office for a follow-up appointment approximately 2 weeks after your surgery. b. Make sure that you call for this appointment the day you arrive home to insure a convenient appointment time.  9. If you have disability or family leave forms that need to be completed, you may have them completed by your  primary care physician's office; for return to work instructions, please ask our office staff and they will be happy to assist you in obtaining this documentation   When to call us [phone]: 1. Poor pain control 2. Reactions / problems with new medications (rash/itching, etc)  3. Fever over 101.5 F (38.5 C) 4. Inability to urinate 5. Nausea/vomiting 6. Worsening swelling or bruising 7. Continued bleeding from incision. 8. Increased pain, redness, or drainage from the incision  The clinic staff is available to answer your questions during regular business hours (8:30am-5pm).  Please dont hesitate to call and ask to speak to one of our nurses for clinical concerns.   A surgeon from [facility] is always on call at the hospitals   If you have a medical emergency, go to the nearest emergency room or call 911.   [facility], [place], [place], [place], [place]  [address] ? MAIN: [phone] FAX [phone] [email or web address]   Post Anesthesia Home Care Instructions  Activity: Get plenty of rest for the remainder of the day. A responsible individual must stay with you for 24 hours following the procedure.  For the next 24 hours, DO NOT: -Drive a car -Paediatric nurse -Drink alcoholic beverages -Take any medication unless instructed by your physician -Make any legal decisions or sign important papers.  Meals: Start with liquid foods such as gelatin or soup. Progress to regular foods as tolerated. Avoid greasy, spicy, heavy foods. If nausea and/or vomiting occur, drink only clear liquids until the nausea and/or vomiting subsides. Call your physician if vomiting continues.  Special Instructions/Symptoms: Your throat may feel dry or sore from the anesthesia or the breathing tube placed in your throat during surgery. If this causes discomfort, gargle with warm salt water. The discomfort should disappear within 24 hours.

## 2018-11-10 ENCOUNTER — Encounter (HOSPITAL_BASED_OUTPATIENT_CLINIC_OR_DEPARTMENT_OTHER): Payer: Self-pay | Admitting: Surgery

## 2018-12-11 ENCOUNTER — Ambulatory Visit: Payer: Self-pay | Admitting: Surgery

## 2018-12-11 NOTE — H&P (Signed)
CC: Postop f/u - operated on by my partner, Dr. Marlou Starks  HPI: Carlos Daugherty is a very pleasant 48yoM with hx of HTN, GERD who was taken to the operating room on 05/29/2018 by my partner, Dr. Marlou Starks, for incision and drainage of a perirectal abscess. He was found to have a anal fistula cyst seen with this with a large hole on the inner portion of the anal canal. This was subsequently controlled with a quarter inch Penrose drain. He was discharged home on antibiotics and returns to see me for follow-up. He denies any recurrent fever/chills. He reports ongoing discomfort related to this drain. He still has some drainage around it. He denies history of incontinence to gas/liquid/solid stool.  He was seen 4/15 for recheck and down-sizing of seton. Reported steady pain with the seton but that it is different than the pain he had when he had an abscess. This he described as intermittent and sharp/shooting like electical in the anal area.  CT pelvis 07/10/2018 - showed no evidence of abscess - does have small pocket of air just proximal to seton which is likely the collapsing cavity he had.  He underwent colonoscopy with Dr. Silverio Decamp 07/28/2018 - - Three 5 to 9 mm polyps in the rectum, in the sigmoid colon and in the transverse colon, removed with a cold snare. Resected and retrieved. - One 11 mm polyp in the sigmoid colon, removed with a hot snare. Resected and retrieved. - Mild diverticulosis in the sigmoid colon and in the descending colon. - Normal mucosa in the entire examined colon. Biopsied. - Non-bleeding internal hemorrhoids. - The examination was otherwise normal.  INTERVAL HX He underwent anorectal exam under anesthesia and placement of a cutting seton 11/07/2018. He was found to have a large amount of scar tissue in the posterior midline with the abscesses have blown up along the anal canal. There is no abnormal appearing tissue, ulceration, or masses. The fistula tract was quite large with a  significant amount of scar tissue surrounding it. The intersphincteric plane had been obliterated. The inner portion was not amenable to advancement flap given the size of the scar from the prior blowout. This appeared to be a low transverse enteric fistula. Given all these findings, a cutting seton was placed.  He has recovered reasonably well from all this. The seton is still in place. He is having bowel movements. He still has some seepage with the seton in place. He denies any recurrent fever/chills. He does have some pain associated with this but states it is only the morning and then resolves throughout the day.  PMH: HTN (well controlled on oral antihypertensive), GERD (well controlled on PPI)  PSH: I&D of perirectal abscess; he denies any other previous anorectal procedures  FHx: Denies FHx of malignancy  Social: Denies use of tobacco/EtOH/drugs  ROS: A comprehensive 10 system review of systems was completed with the patient and pertinent findings as noted above.  The patient is a 48 year old male.   Allergies Carlos Daugherty, [place]; 11/28/2018 3:55 PM) No Known Drug Allergies  [06/17/2018]: Allergies Reconciled   Medication History Carlos Daugherty, [place]; 11/28/2018 3:56 PM) NexIUM (2.5MG  Packet, Oral) Active. Gabapentin (300MG  Capsule, Oral) Active. Medications Reconciled    Review of Systems Carlos Daugherty; 11/28/2018 4:27 PM) General Not Present- Appetite Loss, Chills, Fatigue, Fever, Night Sweats, Weight Gain and Weight Loss. Skin Not Present- Coarse Hair and Cracked Lips. HEENT Not Present- Earache, Hearing Loss, Hoarseness, Nose Bleed, Oral Ulcers, Ringing in  the Ears, Seasonal Allergies, Sinus Pain, Sore Throat, Visual Disturbances, Wears glasses/contact lenses and Yellow Eyes. Respiratory Not Present- Cough and Decreased Exercise Tolerance. Cardiovascular Not Present- Calf Cramps and Chest Pain. Gastrointestinal Present- Rectal Pain. Not Present-  Abdominal Pain, Bloating, Bloody Stool, Change in Bowel Habits, Chronic diarrhea, Constipation, Difficulty Swallowing, Excessive gas, Gets full quickly at meals, Hemorrhoids, Indigestion, Nausea and Vomiting. Neurological Not Present- Decreased Memory, Fainting, Headaches, Numbness, Seizures, Tingling, Tremor, Trouble walking and Weakness. Psychiatric Present- Anxiety and Depression. Endocrine Not Present- Appetite Changes and Cold Intolerance. Hematology Not Present- Abnormal Bleeding, Blood Clots and Blood Thinners.  Vitals Carlos Daugherty CMA; 11/28/2018 3:58 PM) 11/28/2018 3:57 PM Weight: 207.8 lb Height: 76in Body Surface Area: 2.25 m Body Mass Index: 25.29 kg/m  Temp.: 76F  Pulse: 93 (Regular)  P.OX: 93% (Room air) BP: 155/99(Sitting, Left Arm, Standard)       Physical Exam Carlos Daugherty; 11/28/2018 4:28 PM) The physical exam findings are as follows: Note: Constitutional: No acute distress; conversant; no deformities; wearing surgical mask Eyes: Moist conjunctiva Lungs: Normal respiratory effort CV: RRR Anorectal: Blue vessel loop cutting seton in place. No surrounding erythema. No palpable perianal fluctuance on either side or in anal canal.    Assessment & Plan Carlos Daugherty; 11/28/2018 4:32 PM)  ANAL FISTULA (K60.3) Story: [name] is a very pleasant 48yoM whom is now s/p placement of cutting seton (due to findings mentioned above) here today for postop f/u Impression: -Carlos Daugherty is in place. He is now 3 weeks out. If likely that he will require at least one tightening of this seton before it worked its way through. We discussed proceeding with anorectal examined her anesthesia and tightening/replacement of seton. He is not able to tolerate significant instrumentation the anal canal the office which is the rationale for [place] proceeding with monitored anesthesia care. -We discussed today again the operative findings and explanation is what a  cutting seton is. We discussed his particular anatomy and intraoperative findings that led to our inability to perform an endorectal flap and also inability t (due to findings mentioned above) here today for postop f/u Impression: -Carlos Daugherty is in place. He is now 3 weeks out. If likely that he will require at least one tightening of this seton before it worked its way through. We discussed proceeding with anorectal examined her anesthesia and tightening/replacement of seton. He is not able to tolerate significant instrumentation the anal canal the office which is the rationale for [place] proceeding with monitored anesthesia care. -We discussed today again the operative findings and explanation is what a  cutting seton is. We discussed his particular anatomy and intraoperative findings that led to our inability to perform an endorectal flap and also inability to perform a ligation of the intersphincteric fistulous tract -We discussed proceeding with anorectal examination are anesthesia and tightening of the seton versus replacement. -The planned procedure, material risks (including, but not limited to, pain, bleeding, infection, scarring, need for blood transfusion, damage to anal sphincter, incontinence of gas and/or stool, need for additional procedures, recurrence of abscess/fistula, pneumonia, heart attack, stroke, death) benefits and alternatives to surgery were discussed at length. The patient's questions were answered to his satisfaction, he voiced understanding and elected to proceed with surgery. Additionally, we discussed typical postoperative expectations and the recovery process. We discussed potential for additional procedures down the road for further tightening as well if needed  Signed electronically by Ileana Roup, Daugherty (11/28/2018 4:32 PM)

## 2018-12-26 ENCOUNTER — Encounter (HOSPITAL_BASED_OUTPATIENT_CLINIC_OR_DEPARTMENT_OTHER): Payer: Self-pay | Admitting: *Deleted

## 2018-12-26 ENCOUNTER — Other Ambulatory Visit: Payer: Self-pay

## 2018-12-26 NOTE — Progress Notes (Signed)
Spoke w/ via phone for pre-op interview---  PT Lab needs dos---- Istat 8  (current ekg in chart and epic) COVID test ------ 12-29-2018 Arrive at -------  1100 NPO after ------  MN w/ exception clear liquids until 0700 (no cream/ milk products) then nothing by mouth Medications to take morning of surgery -----  Norvasc, Nexium Diabetic medication -----  n/a Patient Special Instructions -----  n/a Pre-Op special Istructions -----  N/a  Patient verbalized understanding of instructions that were given at this phone interview. Patient denies shortness of breath, chest pain, fever, cough a this phone interview.

## 2018-12-29 ENCOUNTER — Other Ambulatory Visit (HOSPITAL_COMMUNITY)
Admission: RE | Admit: 2018-12-29 | Discharge: 2018-12-29 | Disposition: A | Discharge status: Home / Self Care | Payer: BC Managed Care – PPO | Source: Ambulatory Visit | Attending: Surgery | Admitting: Surgery

## 2018-12-29 DIAGNOSIS — Z20828 Contact with and (suspected) exposure to other viral communicable diseases: Secondary | ICD-10-CM | POA: Insufficient documentation

## 2018-12-29 DIAGNOSIS — Z01812 Encounter for preprocedural laboratory examination: Secondary | ICD-10-CM | POA: Insufficient documentation

## 2018-12-29 LAB — SARS CORONAVIRUS 2 (TAT 6-24 HRS): SARS Coronavirus 2: NEGATIVE

## 2019-01-01 ENCOUNTER — Ambulatory Visit (HOSPITAL_BASED_OUTPATIENT_CLINIC_OR_DEPARTMENT_OTHER): Payer: BC Managed Care – PPO | Admitting: Anesthesiology

## 2019-01-01 ENCOUNTER — Encounter (HOSPITAL_BASED_OUTPATIENT_CLINIC_OR_DEPARTMENT_OTHER): Payer: Self-pay

## 2019-01-01 ENCOUNTER — Encounter (HOSPITAL_BASED_OUTPATIENT_CLINIC_OR_DEPARTMENT_OTHER)
Admission: RE | Disposition: A | Discharge status: Home / Self Care | Payer: Self-pay | Source: Home / Self Care | Attending: Surgery

## 2019-01-01 ENCOUNTER — Ambulatory Visit (HOSPITAL_BASED_OUTPATIENT_CLINIC_OR_DEPARTMENT_OTHER)
Admission: RE | Admit: 2019-01-01 | Discharge: 2019-01-01 | Disposition: A | Discharge status: Home / Self Care | Payer: BC Managed Care – PPO | Attending: Surgery | Admitting: Surgery

## 2019-01-01 DIAGNOSIS — F17210 Nicotine dependence, cigarettes, uncomplicated: Secondary | ICD-10-CM | POA: Insufficient documentation

## 2019-01-01 DIAGNOSIS — I10 Essential (primary) hypertension: Secondary | ICD-10-CM | POA: Diagnosis not present

## 2019-01-01 DIAGNOSIS — Z8719 Personal history of other diseases of the digestive system: Secondary | ICD-10-CM | POA: Diagnosis not present

## 2019-01-01 DIAGNOSIS — K603 Anal fistula: Secondary | ICD-10-CM | POA: Diagnosis not present

## 2019-01-01 DIAGNOSIS — M5136 Other intervertebral disc degeneration, lumbar region: Secondary | ICD-10-CM | POA: Diagnosis not present

## 2019-01-01 DIAGNOSIS — K219 Gastro-esophageal reflux disease without esophagitis: Secondary | ICD-10-CM | POA: Insufficient documentation

## 2019-01-01 HISTORY — PX: RECTAL EXAM UNDER ANESTHESIA: SHX6399

## 2019-01-01 LAB — POCT I-STAT, CHEM 8
BUN: 20 mg/dL (ref 6–20)
Calcium, Ion: 1.27 mmol/L (ref 1.15–1.40)
Chloride: 105 mmol/L (ref 98–111)
Creatinine, Ser: 0.6 mg/dL — ABNORMAL LOW (ref 0.61–1.24)
Glucose, Bld: 108 mg/dL — ABNORMAL HIGH (ref 70–99)
HCT: 41 % (ref 39.0–52.0)
Hemoglobin: 13.9 g/dL (ref 13.0–17.0)
Potassium: 3.9 mmol/L (ref 3.5–5.1)
Sodium: 139 mmol/L (ref 135–145)
TCO2: 25 mmol/L (ref 22–32)

## 2019-01-01 SURGERY — EXAM UNDER ANESTHESIA, RECTUM
Anesthesia: Monitor Anesthesia Care

## 2019-01-01 MED ORDER — FENTANYL CITRATE (PF) 100 MCG/2ML IJ SOLN
INTRAMUSCULAR | Status: AC
  Filled 2019-01-01: qty 2

## 2019-01-01 MED ORDER — CHLORHEXIDINE GLUCONATE CLOTH 2 % EX PADS
6.0000 | MEDICATED_PAD | Freq: Once | CUTANEOUS | Status: DC
  Filled 2019-01-01: qty 6

## 2019-01-01 MED ORDER — OXYCODONE HCL 5 MG PO TABS
5.0000 mg | ORAL_TABLET | Freq: Once | ORAL | Status: AC
  Administered 2019-01-01: 5 mg via ORAL
  Filled 2019-01-01: qty 1

## 2019-01-01 MED ORDER — ACETAMINOPHEN 500 MG PO TABS
1000.0000 mg | ORAL_TABLET | ORAL | Status: AC
  Administered 2019-01-01: 1000 mg via ORAL
  Filled 2019-01-01: qty 2

## 2019-01-01 MED ORDER — LIDOCAINE 2% (20 MG/ML) 5 ML SYRINGE
INTRAMUSCULAR | Status: DC | PRN
  Administered 2019-01-01: 60 mg via INTRAVENOUS

## 2019-01-01 MED ORDER — BUPIVACAINE LIPOSOME 1.3 % IJ SUSP
20.0000 mL | Freq: Once | INTRAMUSCULAR | Status: DC
  Filled 2019-01-01: qty 20

## 2019-01-01 MED ORDER — LIDOCAINE 2% (20 MG/ML) 5 ML SYRINGE
INTRAMUSCULAR | Status: AC
  Filled 2019-01-01: qty 5

## 2019-01-01 MED ORDER — OXYCODONE HCL 5 MG PO TABS
ORAL_TABLET | ORAL | Status: AC
  Filled 2019-01-01: qty 1

## 2019-01-01 MED ORDER — ROCURONIUM BROMIDE 10 MG/ML (PF) SYRINGE
PREFILLED_SYRINGE | INTRAVENOUS | Status: AC
  Filled 2019-01-01: qty 10

## 2019-01-01 MED ORDER — DIBUCAINE (PERIANAL) 1 % EX OINT: TOPICAL_OINTMENT | CUTANEOUS | Status: DC | PRN

## 2019-01-01 MED ORDER — LACTATED RINGERS IV SOLN
INTRAVENOUS | Status: DC
  Administered 2019-01-01: 12:00:00 via INTRAVENOUS
  Filled 2019-01-01: qty 1000

## 2019-01-01 MED ORDER — OXYCODONE HCL 5 MG PO TABS: 5.0000 mg | ORAL_TABLET | Freq: Four times a day (QID) | ORAL | 0 refills | Status: AC | PRN

## 2019-01-01 MED ORDER — PROPOFOL 10 MG/ML IV BOLUS
INTRAVENOUS | Status: DC | PRN
  Administered 2019-01-01: 50 mg via INTRAVENOUS

## 2019-01-01 MED ORDER — MIDAZOLAM HCL 2 MG/2ML IJ SOLN
INTRAMUSCULAR | Status: DC | PRN
  Administered 2019-01-01 (×2): 1 mg via INTRAVENOUS

## 2019-01-01 MED ORDER — BUPIVACAINE-EPINEPHRINE 0.25% -1:200000 IJ SOLN
INTRAMUSCULAR | Status: DC | PRN
  Administered 2019-01-01: 21 mL

## 2019-01-01 MED ORDER — FENTANYL CITRATE (PF) 100 MCG/2ML IJ SOLN
INTRAMUSCULAR | Status: DC | PRN
  Administered 2019-01-01: 50 ug via INTRAVENOUS
  Administered 2019-01-01 (×2): 25 ug via INTRAVENOUS

## 2019-01-01 MED ORDER — FENTANYL CITRATE (PF) 100 MCG/2ML IJ SOLN
25.0000 ug | INTRAMUSCULAR | Status: DC | PRN
  Administered 2019-01-01 (×3): 50 ug via INTRAVENOUS
  Filled 2019-01-01: qty 1

## 2019-01-01 MED ORDER — PROPOFOL 10 MG/ML IV BOLUS
INTRAVENOUS | Status: AC
  Filled 2019-01-01: qty 20

## 2019-01-01 MED ORDER — MIDAZOLAM HCL 2 MG/2ML IJ SOLN
INTRAMUSCULAR | Status: AC
  Filled 2019-01-01: qty 2

## 2019-01-01 MED ORDER — SUCCINYLCHOLINE CHLORIDE 200 MG/10ML IV SOSY
PREFILLED_SYRINGE | INTRAVENOUS | Status: AC
  Filled 2019-01-01: qty 10

## 2019-01-01 MED ORDER — ACETAMINOPHEN 500 MG PO TABS
ORAL_TABLET | ORAL | Status: AC
  Filled 2019-01-01: qty 2

## 2019-01-01 MED ORDER — BUPIVACAINE LIPOSOME 1.3 % IJ SUSP
INTRAMUSCULAR | Status: DC | PRN
  Administered 2019-01-01: 12 mL

## 2019-01-01 MED ORDER — PROPOFOL 500 MG/50ML IV EMUL
INTRAVENOUS | Status: DC | PRN
  Administered 2019-01-01: 200 ug/kg/min via INTRAVENOUS

## 2019-01-01 SURGICAL SUPPLY — 59 items
BENZOIN TINCTURE PRP APPL 2/3 (GAUZE/BANDAGES/DRESSINGS) IMPLANT
BLADE EXTENDED COATED 6.5IN (ELECTRODE) IMPLANT
BLADE HEX COATED 2.75 (ELECTRODE) ×3 IMPLANT
BLADE SURG 10 STRL SS (BLADE) IMPLANT
BLADE SURG 15 STRL LF DISP TIS (BLADE) ×1 IMPLANT
BLADE SURG 15 STRL SS (BLADE) ×2
BRIEF STRETCH FOR OB PAD LRG (UNDERPADS AND DIAPERS) ×3 IMPLANT
CANISTER SUCT 3000ML PPV (MISCELLANEOUS) ×3 IMPLANT
COVER BACK TABLE 60X90IN (DRAPES) ×3 IMPLANT
COVER MAYO STAND STRL (DRAPES) ×3 IMPLANT
COVER WAND RF STERILE (DRAPES) ×3 IMPLANT
DECANTER SPIKE VIAL GLASS SM (MISCELLANEOUS) ×3 IMPLANT
DRAPE LAPAROTOMY 100X72 PEDS (DRAPES) ×3 IMPLANT
DRAPE UTILITY XL STRL (DRAPES) ×3 IMPLANT
GAUZE SPONGE 4X4 12PLY STRL (GAUZE/BANDAGES/DRESSINGS) ×3 IMPLANT
GAUZE SPONGE 4X4 3PLY NS LF (GAUZE/BANDAGES/DRESSINGS) ×3 IMPLANT
GLOVE BIO SURGEON STRL SZ7.5 (GLOVE) ×3 IMPLANT
GLOVE INDICATOR 8.0 STRL GRN (GLOVE) ×3 IMPLANT
GOWN STRL REUS W/TWL LRG LVL3 (GOWN DISPOSABLE) ×3 IMPLANT
HYDROGEN PEROXIDE 16OZ (MISCELLANEOUS) IMPLANT
IV CATH 14GX2 1/4 (CATHETERS) IMPLANT
IV CATH 18G SAFETY (IV SOLUTION) IMPLANT
KIT SIGMOIDOSCOPE (SET/KITS/TRAYS/PACK) IMPLANT
KIT TURNOVER CYSTO (KITS) ×3 IMPLANT
LOOP VESSEL MAXI BLUE (MISCELLANEOUS) ×3 IMPLANT
NDL SAFETY ECLIPSE 18X1.5 (NEEDLE) IMPLANT
NEEDLE HYPO 18GX1.5 SHARP (NEEDLE)
NEEDLE HYPO 22GX1.5 SAFETY (NEEDLE) ×3 IMPLANT
NS IRRIG 500ML POUR BTL (IV SOLUTION) ×3 IMPLANT
PACK BASIN DAY SURGERY FS (CUSTOM PROCEDURE TRAY) ×3 IMPLANT
PAD ABD 8X10 STRL (GAUZE/BANDAGES/DRESSINGS) ×3 IMPLANT
PENCIL BUTTON HOLSTER BLD 10FT (ELECTRODE) ×3 IMPLANT
SPONGE HEMORRHOID 8X3CM (HEMOSTASIS) IMPLANT
SPONGE SURGIFOAM ABS GEL 12-7 (HEMOSTASIS) IMPLANT
SUCTION FRAZIER HANDLE 10FR (MISCELLANEOUS)
SUCTION TUBE FRAZIER 10FR DISP (MISCELLANEOUS) IMPLANT
SUT CHROMIC 2 0 SH (SUTURE) IMPLANT
SUT CHROMIC 3 0 SH 27 (SUTURE) IMPLANT
SUT MNCRL AB 4-0 PS2 18 (SUTURE) IMPLANT
SUT SILK 0 PSL NDL (SUTURE) IMPLANT
SUT SILK 0 TIES 10X30 (SUTURE) ×3 IMPLANT
SUT SILK 2 0 (SUTURE)
SUT SILK 2-0 18XBRD TIE 12 (SUTURE) IMPLANT
SUT VIC AB 2-0 SH 27 (SUTURE)
SUT VIC AB 2-0 SH 27XBRD (SUTURE) IMPLANT
SUT VIC AB 3-0 SH 18 (SUTURE) IMPLANT
SUT VIC AB 3-0 SH 27 (SUTURE)
SUT VIC AB 3-0 SH 27X BRD (SUTURE) IMPLANT
SUT VIC AB 3-0 SH 27XBRD (SUTURE) IMPLANT
SUT VIC AB 4-0 P-3 18XBRD (SUTURE) IMPLANT
SUT VIC AB 4-0 P3 18 (SUTURE)
SYR 20ML LL LF (SYRINGE) IMPLANT
SYR BULB IRRIGATION 50ML (SYRINGE) ×3 IMPLANT
SYR CONTROL 10ML LL (SYRINGE) ×3 IMPLANT
TOWEL OR 17X26 10 PK STRL BLUE (TOWEL DISPOSABLE) ×3 IMPLANT
TRAY DSU PREP LF (CUSTOM PROCEDURE TRAY) ×3 IMPLANT
TUBE CONNECTING 12'X1/4 (SUCTIONS) ×1
TUBE CONNECTING 12X1/4 (SUCTIONS) ×2 IMPLANT
YANKAUER SUCT BULB TIP NO VENT (SUCTIONS) ×3 IMPLANT

## 2019-01-01 NOTE — Anesthesia Preprocedure Evaluation (Addendum)
Anesthesia Evaluation  Patient identified by MRN, date of birth, ID band Patient awake    Reviewed: Allergy & Precautions, NPO status , Patient's Chart, lab work & pertinent test results  Airway Mallampati: II  TM Distance: >3 FB     Dental   Pulmonary Current Smoker,    breath sounds clear to auscultation       Cardiovascular hypertension,  Rhythm:Regular Rate:Normal     Neuro/Psych    GI/Hepatic Neg liver ROS, GERD  ,  Endo/Other  negative endocrine ROS  Renal/GU negative Renal ROS     Musculoskeletal   Abdominal   Peds  Hematology   Anesthesia Other Findings   Reproductive/Obstetrics                             Anesthesia Physical Anesthesia Plan  ASA: III  Anesthesia Plan: MAC   Post-op Pain Management:    Induction: Intravenous  PONV Risk Score and Plan: 1 and Ondansetron, Dexamethasone and Midazolam  Airway Management Planned: Nasal Cannula and Simple Face Mask  Additional Equipment:   Intra-op Plan:   Post-operative Plan:   Informed Consent: I have reviewed the patients History and Physical, chart, labs and discussed the procedure including the risks, benefits and alternatives for the proposed anesthesia with the patient or authorized representative who has indicated his/her understanding and acceptance.     Dental advisory given  Plan Discussed with: CRNA and Anesthesiologist  Anesthesia Plan Comments:        Anesthesia Quick Evaluation

## 2019-01-01 NOTE — Transfer of Care (Signed)
Immediate Anesthesia Transfer of Care Note  Patient: Carlos Daugherty  Procedure(s) Performed: Procedure(s) (LRB): ANORECTAL EXAM UNDER ANESTHESIA, TIGHTENING OF CUTTING SETON (N/A)  Patient Location: PACU  Anesthesia Type: MAC  Level of Consciousness: awake, alert , oriented and patient cooperative  Airway & Oxygen Therapy: Patient Spontanous Breathing and Patient connected to face mask oxygen  Post-op Assessment: Report given to PACU RN and Post -op Vital signs reviewed and stable  Post vital signs: Reviewed and stable  Complications: No apparent anesthesia complications Last Vitals:  Vitals Value Taken Time  BP 117/85 01/01/19 1431  Temp    Pulse 86 01/01/19 1431  Resp 19 01/01/19 1431  SpO2 95 % 01/01/19 1431  Vitals shown include unvalidated device data.  Last Pain:  Vitals:   01/01/19 1140  TempSrc: Oral  PainSc: 0-No pain      Patients Stated Pain Goal: 5 (01/01/19 1140)

## 2019-01-01 NOTE — Discharge Instructions (Addendum)
ANORECTAL SURGERY: POST OP INSTRUCTIONS  1. DIET: Follow a light bland diet the first 24 hours after arrival home, such as soup, liquids, crackers, etc.  Be sure to include lots of fluids daily.  Avoid fast food or heavy meals as your are more likely to get nauseated.  Eat a low fat diet the next few days after surgery.    2. Take your usually prescribed home medications unless otherwise directed.  3. PAIN CONTROL: a. It is helpful to take an over-the-counter pain medication regularly for the first few days/weeks.  Choose from the following that works best for you: i. Ibuprofen (Advil, etc) Three 200mg  tabs every 6 hours as needed. ii. Acetaminophen (Tylenol, etc) 500-650mg  every 6 hours as needed iii. NOTE: You may take both of these medications together - most patients find it most helpful when alternating between the two (i.e. Ibuprofen at 6am, tylenol at 9am, ibuprofen at 12pm ...) b. A  prescription for pain medication may have been prescribed for you at discharge.  Take your pain medication as prescribed.  i. If you are having problems/concerns with the prescription medicine, please call us for further advice.  4. Avoid getting constipated.  Between the surgery and the pain medications, it is common to experience some constipation.  Increasing fluid intake (64oz of water per day) and taking a fiber supplement (such as Metamucil, Citrucel, FiberCon) 1-2 times a day regularly will usually help prevent this problem from occurring.  Take Miralax (over the counter) 1-2x/day while taking a narcotic pain medication. If no bowel movement after 48hours, you may additionally take a laxative like a bottle of Milk of Magnesia which can be purchased over the counter. Avoid enemas if possible as these are often painful.   5. Watch out for diarrhea.  If you have many loose bowel movements, simplify your diet to bland foods.  Stop any stool softeners and decrease your fiber supplement. If this worsens or does  not improve, please call us.  6. Wash / shower every day.  If you were discharged with a dressing, you may remove this the day after your surgery. You may shower normally, getting soap/water on your wound, particularly after bowel movements.  7. Soaking in a warm bath filled a couple inches ("Sitz bath") is a great way to clean the area after a bowel movement and many patients find it is a way to soothe the area.  8. ACTIVITIES as tolerated:   a. You may resume regular (light) daily activities beginning the next day--such as daily self-care, walking, climbing stairs--gradually increasing activities as tolerated.  If you can walk 30 minutes without difficulty, it is safe to try more intense activity such as jogging, treadmill, bicycling, low-impact aerobics, etc. b. Refrain from any heavy lifting or straining for the first 2 weeks after your procedure, particularly if your surgery was for hemorrhoids. c. Avoid activities that make your pain worse d. You may drive when you are no longer taking prescription pain medication, you can comfortably wear a seatbelt, and you can safely maneuver your car and apply brakes.  9. FOLLOW UP in our office a. Please call [facility] at [phone] to set up an appointment to see your surgeon in the office for a follow-up appointment approximately 2 weeks after your surgery. b. Make sure that you call for this appointment the day you arrive home to insure a convenient appointment time.  9. If you have disability or family leave forms that need to be completed,  you may have them completed by your primary care physician's office; for return to work instructions, please ask our office staff and they will be happy to assist you in obtaining this documentation   When to call us [phone]: 1. Poor pain control 2. Reactions / problems with new medications (rash/itching, etc)  3. Fever over 101.5 F (38.5 C) 4. Inability to urinate 5. Nausea/vomiting 6. Worsening  swelling or bruising 7. Continued bleeding from incision. 8. Increased pain, redness, or drainage from the incision  The clinic staff is available to answer your questions during regular business hours (8:30am-5pm).  Please dont hesitate to call and ask to speak to one of our nurses for clinical concerns.   A surgeon from [facility] is always on call at the hospitals   If you have a medical emergency, go to the nearest emergency room or call 911.   [facility], [place], [address], [place], [place]  [address] ? MAIN: [phone] FAX [phone] [email or web address]  Information for Discharge Teaching: EXPAREL (bupivacaine liposome injectable suspension)   Your surgeon or anesthesiologist gave you EXPAREL(bupivacaine) to help control your pain after surgery.   EXPAREL is a local anesthetic that provides pain relief by numbing the tissue around the surgical site.  EXPAREL is designed to release pain medication over time and can control pain for up to 72 hours.  Depending on how you respond to EXPAREL, you may require less pain medication during your recovery.  Possible side effects:  Temporary loss of sensation or ability to move in the area where bupivacaine was injected.  Nausea, vomiting, constipation  Rarely, numbness and tingling in your mouth or lips, lightheadedness, or anxiety may occur.  Call your doctor right away if you think you may be experiencing any of these sensations, or if you have other questions regarding possible side effects.  Follow all other discharge instructions given to you by your surgeon or nurse. Eat a healthy diet and drink plenty of water or other fluids.  If you return to the hospital for any reason within 96 hours following the administration of EXPAREL, it is important for health care providers to know that you have received this anesthetic. A teal colored band has been placed on your arm  with the date, time and amount of EXPAREL you have received in order to alert and inform your health care providers. Please leave this armband in place for the full 96 hours following administration, and then you may remove the band.   Post Anesthesia Home Care Instructions  Activity: Get plenty of rest for the remainder of the day. A responsible individual must stay with you for 24 hours following the procedure.  For the next 24 hours, DO NOT: -Drive a car -Paediatric nurse -Drink alcoholic beverages -Take any medication unless instructed by your physician -Make any legal decisions or sign important papers.  Meals: Start with liquid foods such as gelatin or soup. Progress to regular foods as tolerated. Avoid greasy, spicy, heavy foods. If nausea and/or vomiting occur, drink only clear liquids until the nausea and/or vomiting subsides. Call your physician if vomiting continues.  Special Instructions/Symptoms: Your throat may feel dry or sore from the anesthesia or the breathing tube placed in your throat during surgery. If this causes discomfort, gargle with warm salt water. The discomfort should disappear within 24 hours.

## 2019-01-01 NOTE — Op Note (Addendum)
01/01/2019  2:26 PM  PATIENT:  Carlos Daugherty  48 y.o. male  Patient Care Team: Maurice Small, MD as PCP - General (Family Medicine)  PRE-OPERATIVE DIAGNOSIS:  Anal fistula  POST-OPERATIVE DIAGNOSIS:  Same  PROCEDURE:   1. Tightening of cutting seton 2. Anorectal exam under anesthesia  SURGEON:  Surgeon(s): Ileana Roup, MD   ANESTHESIA:   local and MAC  SPECIMEN:  No Specimen  DISPOSITION OF SPECIMEN:  N/A  COUNTS:  Sponge, needle, and instrument counts were reported correct x2 at conclusion.  EBL: 1 mL  Drains: None  PLAN OF CARE: Discharge to home after PACU  PATIENT DISPOSITION:  PACU - hemodynamically stable.  OR FINDINGS: Significant sedation was necessary by anesthesia to achieve a MAC. He would not have been able to tolerate this procedure in the office or with just local anesthesia. Blue vessel loop cutting seton in place - has now become somewhat loose as it has done its job of slowly cutting. Significant posterior anal canal scar consistent with prior abscess blowout into anal canal/rectum. [name] was tightened until it was snug on the small remaining bundle of tissue.  DESCRIPTION: The patient was identified in the preoperative holding area and taken to the OR where he was placed on the operating room table. SCDs were placed.  MAC anesthesia was induced without difficulty. The patient was then positioned in lithotomy with Allen stirrups.  Pressure points were then padded and verified.  He was then prepped and draped in the standard sterile fashion. A surgical timeout was performed indicating the correct patient, procedure, positioning.  A rectal block was performed using a dilute mixture of 0.25% Marcaine with epinephrine and Exparel.  A well lubricated digital rectal exam was performed which demonstrated normal anal tone and the cutting seton to be in position in the posterior portion.  The seton had become somewhat loose as it has worked its way through the  tissue as expected.  A Hill-Ferguson anoscope was into the anal canal and circumferential inspection demonstrated significant scarring as noted at previous procedure in the posterior portion of the anal canal.  This is consistent with the prior abscess blowout that he had sustained.  The remaining bundle of tissue was quite small (estimated to be approximately 5 mm thick.  The seton was tightened by applying gentle traction to it and then snugly placing two 0 silk ties.  Additional anesthetic was infiltrated into the perianal tissues.  Hemostasis was achieved.  A dressing consisting of 4 x 4's, ABD, and mesh underwear were placed.  He was then taken out of the lithotomy position, awakened from anesthesia, and transferred to a stretcher for transport to PACU in satisfactory condition.  DISPOSITION: PACU in satisfactory condition.

## 2019-01-01 NOTE — H&P (Signed)
CC: Here today for surgery  HPI: Mr. Carlos Daugherty is a very pleasant 48yoM with hx of HTN, GERD who was taken to the operating room on 05/29/2018 by my partner, Dr. Marlou Starks, for incision and drainage of a perirectal abscess. He was found to have a anal fistula cyst seen with this with a large hole on the inner portion of the anal canal. This was subsequently controlled with a quarter inch Penrose drain. He was discharged home on antibiotics and returns to see me for follow-up. He denies any recurrent fever/chills. He reports ongoing discomfort related to this drain. He still has some drainage around it. He denies history of incontinence to gas/liquid/solid stool.  He was seen 4/15 for recheck and down-sizing of seton. Reported steady pain with the seton but that it is different than the pain he had when he had an abscess. This he described as intermittent and sharp/shooting like electical in the anal area.  CT pelvis 07/10/2018 - showed no evidence of abscess - does have small pocket of air just proximal to seton which is likely the collapsing cavity he had.  He underwent colonoscopy with Dr. Silverio Decamp 07/28/2018 - - Three 5 to 9 mm polyps in the rectum, in the sigmoid colon and in the transverse colon, removed with a cold snare. Resected and retrieved. - One 11 mm polyp in the sigmoid colon, removed with a hot snare. Resected and retrieved. - Mild diverticulosis in the sigmoid colon and in the descending colon. - Normal mucosa in the entire examined colon. Biopsied. - Non-bleeding internal hemorrhoids. - The examination was otherwise normal.  INTERVAL HX He underwent anorectal exam under anesthesia and placement of a cutting seton 11/07/2018. He was found to have a large amount of scar tissue in the posterior midline with the abscesses have blown out along the anal canal. There is no abnormal appearing tissue, ulceration, or masses. The fistula tract was quite large with a significant amount of  scar tissue surrounding it. The intersphincteric plane had been obliterated. The inner portion was not amenable to advancement flap given the size of the scar from the prior blowout. This appeared to be a low transsphincteric fistula. Given all these findings, a cutting seton was placed.  He recovered reasonably well from all this and was seen back in the office.The seton is still in place. He is having bowel movements. He still has some seepage with the seton in place. He denies any recurrent fever/chills. He does have some pain associated with this but states it is only the morning and then resolves throughout the day.  States he is ready for surgery. Pain has improved a lot and he is now able to drive a riding mower and cut the grass. Denies any issues with incontinence to gas, liquid or solid stool; does have some drainage related to the fistula but this is primarily around the AM BM and then stops completely.  Seton remains in place   PMH: HTN (well controlled on oral antihypertensive), GERD (well controlled on PPI)  PSH: I&D of perirectal abscess; he denies any other previous anorectal procedures  FHx: Denies FHx of malignancy  Social: Denies use of tobacco/EtOH/drugs  ROS: A comprehensive 10 system review of systems was completed with the patient and pertinent findings as noted above. Past Medical History:  Diagnosis Date  . Anal fistula   . Anxiety   . Arthritis   . Depression   . GERD (gastroesophageal reflux disease)   . Hypertension  followed by pcp    Past Surgical History:  Procedure Laterality Date  . COLONOSCOPY  07-28-2018   dr Silverio Decamp  . FISTULOTOMY N/A 11/07/2018   Procedure: PARTIAL FISTULOTOMY WITH CUTTING SETON PLACEMENT;  Surgeon: Ileana Roup, MD;  Location: [facility];  Service: General;  Laterality: N/A;  . INCISION AND DRAINAGE PERIRECTAL ABSCESS N/A 05/29/2018   Procedure: IRRIGATION AND DEBRIDEMENT PERIRECTAL  ABSCESS;  Surgeon: Jovita Kussmaul, MD;  Location: [facility];  Service: General;  Laterality: N/A;  . INGUINAL HERNIA REPAIR Bilateral age 56 and age 81  . MAXIMUM ACCESS (MAS)POSTERIOR LUMBAR INTERBODY FUSION (PLIF) 1 LEVEL N/A 09/27/2014   Procedure:  L4-L5 mas PLIF  ;  Surgeon: Earnie Larsson, MD;  Location: [facility];  Service: Neurosurgery;  Laterality: N/A;   L4-L5 mas PLIF    . NASAL SINUS SURGERY  2015    Family History  Problem Relation Age of Onset  . Leukemia Father   . Colon cancer Neg Hx   . Esophageal cancer Neg Hx   . Rectal cancer Neg Hx   . Stomach cancer Neg Hx     Social:  reports that he has been smoking cigarettes. He has a 30.00 pack-year smoking history. He has never used smokeless tobacco. He reports current alcohol use. He reports current drug use. Drug: Marijuana.  Allergies: No Known Allergies  Medications: I have reviewed the patient's current medications.  Results for orders placed or performed during the hospital encounter of 01/01/19 (from the past 48 hour(s))  I-STAT, chem 8     Status: Abnormal   Collection Time: 01/01/19 11:56 AM  Result Value Ref Range   Sodium 139 135 - 145 mmol/L   Potassium 3.9 3.5 - 5.1 mmol/L   Chloride 105 98 - 111 mmol/L   BUN 20 6 - 20 mg/dL   Creatinine, Ser 0.60 (L) 0.61 - 1.24 mg/dL   Glucose, Bld 108 (H) 70 - 99 mg/dL   Calcium, Ion 1.27 1.15 - 1.40 mmol/L   TCO2 25 22 - 32 mmol/L   Hemoglobin 13.9 13.0 - 17.0 g/dL   HCT 41.0 39.0 - 52.0 %    No results found.  ROS - all of the below systems have been reviewed with the patient and positives are indicated with bold text General: chills, fever or night sweats Eyes: blurry vision or double vision ENT: epistaxis or sore throat Allergy/Immunology: itchy/watery eyes or nasal congestion Hematologic/Lymphatic: bleeding problems, blood clots or swollen lymph nodes Endocrine: temperature intolerance or unexpected weight changes Breast: new or changing breast lumps or  nipple discharge Resp: cough, shortness of breath, or wheezing CV: chest pain or dyspnea on exertion GI: as per HPI GU: dysuria, trouble voiding, or hematuria MSK: joint pain or joint stiffness Neuro: TIA or stroke symptoms Derm: pruritus and skin lesion changes Psych: anxiety and depression  PE Blood pressure (!) 137/91, pulse 98, temperature 97.8 F (36.6 C), temperature source Oral, resp. rate 16, height 6\' 4"  (1.93 m), weight 94.1 kg, SpO2 100 %. Constitutional: NAD; conversant; no deformities; wearing surgical mask Eyes: Moist conjunctiva; no lid lag; anicteric; PERRL Neck: Trachea midline; no thyromegaly Lungs: Normal respiratory effort; no tactile fremitus CV: RRR; no palpable thrills; no pitting edema GI: Abd soft, NT/ND; no palpable hepatosplenomegaly MSK: Normal gait; no clubbing/cyanosis Psychiatric: Appropriate affect; alert and oriented x3 Lymphatic: No palpable cervical or axillary lymphadenopathy  Results for orders placed or performed during the hospital encounter of 01/01/19 (from the past  48 hour(s))  I-STAT, chem 8     Status: Abnormal   Collection Time: 01/01/19 11:56 AM  Result Value Ref Range   Sodium 139 135 - 145 mmol/L   Potassium 3.9 3.5 - 5.1 mmol/L   Chloride 105 98 - 111 mmol/L   BUN 20 6 - 20 mg/dL   Creatinine, Ser 0.60 (L) 0.61 - 1.24 mg/dL   Glucose, Bld 108 (H) 70 - 99 mg/dL   Calcium, Ion 1.27 1.15 - 1.40 mmol/L   TCO2 25 22 - 32 mmol/L   Hemoglobin 13.9 13.0 - 17.0 g/dL   HCT 41.0 39.0 - 52.0 %   A/P: Mr. Guirguis is a very pleasant 3yoM whom is now s/p placement of cutting seton (due to findings mentioned above) here today for postop f/u  -[name] is in place. We discussed proceeding with anorectal exam under anesthesia and tightening/replacement of seton. He is not able to tolerate significant instrumentation the anal canal the office which is the rationale for [place] proceeding with monitored anesthesia care. -We discussed today again the  operative findings and explanation is what a cutting seton is. We discussed his particular anatomy and intraoperative findings that led to our inability to perform an endorectal flap and also inability to perform a ligation of the intersphincteric fistulous tract -We discussed proceeding with anorectal examination are anesthesia and tightening of the seton versus replacement. -The planned procedure, material risks (including, but not limited to, pain, bleeding, infection, scarring, need for blood transfusion, damage to anal sphincter, incontinence of gas and/or stool, need for additional procedures, recurrence of abscess/fistula, pneumonia, heart attack, stroke, death) benefits and alternatives to surgery were discussed at length. The patient's questions were answered to his satisfaction, he voiced understanding and elected to proceed with surgery. Additionally, we discussed typical postoperative expectations and the recovery process. We discussed potential for additional procedures down the road for further tightening as well if needed  Sharon Mt. Dema Severin, M.D. [facility]

## 2019-01-02 ENCOUNTER — Encounter (HOSPITAL_BASED_OUTPATIENT_CLINIC_OR_DEPARTMENT_OTHER): Payer: Self-pay | Admitting: Surgery

## 2019-01-02 NOTE — Anesthesia Postprocedure Evaluation (Signed)
Anesthesia Post Note  Patient: Carlos Daugherty  Procedure(s) Performed: ANORECTAL EXAM UNDER ANESTHESIA, TIGHTENING OF CUTTING SETON (N/A )     Patient location during evaluation: PACU Anesthesia Type: MAC Level of consciousness: awake and alert Pain management: pain level controlled Vital Signs Assessment: post-procedure vital signs reviewed and stable Respiratory status: spontaneous breathing, nonlabored ventilation and respiratory function stable Cardiovascular status: stable and blood pressure returned to baseline Postop Assessment: no apparent nausea or vomiting Anesthetic complications: no    Last Vitals:  Vitals:   01/01/19 1515 01/01/19 1549  BP: 134/85 120/84  Pulse: 71 72  Resp: 14 14  Temp: 36.5 C 36.5 C  SpO2: 97% 99%    Last Pain:  Vitals:   01/01/19 1549  TempSrc:   PainSc: [place]

## 2019-03-03 DIAGNOSIS — J029 Acute pharyngitis, unspecified: Secondary | ICD-10-CM | POA: Diagnosis not present

## 2019-03-04 DIAGNOSIS — J3489 Other specified disorders of nose and nasal sinuses: Secondary | ICD-10-CM | POA: Diagnosis not present

## 2019-03-04 DIAGNOSIS — J029 Acute pharyngitis, unspecified: Secondary | ICD-10-CM | POA: Diagnosis not present

## 2019-04-16 DIAGNOSIS — K2270 Barrett's esophagus without dysplasia: Secondary | ICD-10-CM | POA: Diagnosis not present

## 2019-04-16 DIAGNOSIS — K603 Anal fistula: Secondary | ICD-10-CM | POA: Diagnosis not present

## 2019-04-16 DIAGNOSIS — Z86010 Personal history of colonic polyps: Secondary | ICD-10-CM | POA: Diagnosis not present

## 2019-04-16 DIAGNOSIS — K219 Gastro-esophageal reflux disease without esophagitis: Secondary | ICD-10-CM | POA: Diagnosis not present

## 2019-05-11 DIAGNOSIS — Z1159 Encounter for screening for other viral diseases: Secondary | ICD-10-CM | POA: Diagnosis not present

## 2019-05-12 DIAGNOSIS — K2270 Barrett's esophagus without dysplasia: Secondary | ICD-10-CM | POA: Diagnosis not present

## 2019-06-28 ENCOUNTER — Emergency Department (HOSPITAL_COMMUNITY)
Admission: EM | Admit: 2019-06-28 | Discharge: 2019-06-29 | Disposition: A | Discharge status: Home / Self Care | Payer: BC Managed Care – PPO | Attending: Emergency Medicine | Admitting: Emergency Medicine

## 2019-06-28 ENCOUNTER — Encounter (HOSPITAL_COMMUNITY): Payer: Self-pay

## 2019-06-28 ENCOUNTER — Other Ambulatory Visit: Payer: Self-pay

## 2019-06-28 DIAGNOSIS — I10 Essential (primary) hypertension: Secondary | ICD-10-CM | POA: Diagnosis not present

## 2019-06-28 DIAGNOSIS — K6289 Other specified diseases of anus and rectum: Secondary | ICD-10-CM | POA: Diagnosis not present

## 2019-06-28 DIAGNOSIS — Z79899 Other long term (current) drug therapy: Secondary | ICD-10-CM | POA: Insufficient documentation

## 2019-06-28 DIAGNOSIS — F17210 Nicotine dependence, cigarettes, uncomplicated: Secondary | ICD-10-CM | POA: Insufficient documentation

## 2019-06-28 DIAGNOSIS — K610 Anal abscess: Secondary | ICD-10-CM | POA: Diagnosis not present

## 2019-06-28 NOTE — ED Triage Notes (Signed)
Patient arrived with complaints of anal pain, reporting having a fistula cyst in March. Reporting blood tinged drainage and increased pain since Friday. Taking OTC medication for pain.

## 2019-06-29 ENCOUNTER — Encounter (HOSPITAL_COMMUNITY): Payer: Self-pay

## 2019-06-29 ENCOUNTER — Emergency Department (HOSPITAL_COMMUNITY): Payer: BC Managed Care – PPO

## 2019-06-29 DIAGNOSIS — K610 Anal abscess: Secondary | ICD-10-CM | POA: Diagnosis not present

## 2019-06-29 LAB — CBC WITH DIFFERENTIAL/PLATELET
Abs Immature Granulocytes: 0.06 10*3/uL (ref 0.00–0.07)
Basophils Absolute: 0.1 10*3/uL (ref 0.0–0.1)
Basophils Relative: 1 %
Eosinophils Absolute: 0.2 10*3/uL (ref 0.0–0.5)
Eosinophils Relative: 1 %
HCT: 37.3 % — ABNORMAL LOW (ref 39.0–52.0)
Hemoglobin: 11.8 g/dL — ABNORMAL LOW (ref 13.0–17.0)
Immature Granulocytes: 0 %
Lymphocytes Relative: 16 %
Lymphs Abs: 2.3 10*3/uL (ref 0.7–4.0)
MCH: 26.5 pg (ref 26.0–34.0)
MCHC: 31.6 g/dL (ref 30.0–36.0)
MCV: 83.6 fL (ref 80.0–100.0)
Monocytes Absolute: 1.6 10*3/uL — ABNORMAL HIGH (ref 0.1–1.0)
Monocytes Relative: 11 %
Neutro Abs: 9.9 10*3/uL — ABNORMAL HIGH (ref 1.7–7.7)
Neutrophils Relative %: 71 %
Platelets: 208 10*3/uL (ref 150–400)
RBC: 4.46 MIL/uL (ref 4.22–5.81)
RDW: 19.4 % — ABNORMAL HIGH (ref 11.5–15.5)
WBC: 13.9 10*3/uL — ABNORMAL HIGH (ref 4.0–10.5)
nRBC: 0 % (ref 0.0–0.2)

## 2019-06-29 LAB — BASIC METABOLIC PANEL
Anion gap: 13 (ref 5–15)
BUN: 11 mg/dL (ref 6–20)
CO2: 24 mmol/L (ref 22–32)
Calcium: 9.6 mg/dL (ref 8.9–10.3)
Chloride: 98 mmol/L (ref 98–111)
Creatinine, Ser: 0.78 mg/dL (ref 0.61–1.24)
GFR calc Af Amer: >60 mL/min (ref >60)
GFR calc non Af Amer: >60 mL/min (ref >60)
Glucose, Bld: 96 mg/dL (ref 70–99)
Potassium: 4 mmol/L (ref 3.5–5.1)
Sodium: 135 mmol/L (ref 135–145)

## 2019-06-29 LAB — LACTIC ACID, PLASMA: Lactic Acid, Venous: 1.4 mmol/L (ref 0.5–1.9)

## 2019-06-29 MED ORDER — IOHEXOL 300 MG/ML  SOLN
100.0000 mL | Freq: Once | INTRAMUSCULAR | Status: AC | PRN
  Administered 2019-06-29: 02:00:00 100 mL via INTRAVENOUS

## 2019-06-29 MED ORDER — OXYCODONE-ACETAMINOPHEN 5-325 MG PO TABS: 1.0000 | ORAL_TABLET | ORAL | 0 refills | Status: AC | PRN

## 2019-06-29 MED ORDER — METRONIDAZOLE 500 MG PO TABS
500.0000 mg | ORAL_TABLET | Freq: Once | ORAL | Status: AC
  Administered 2019-06-29: 06:00:00 500 mg via ORAL
  Filled 2019-06-29: qty 1

## 2019-06-29 MED ORDER — CIPROFLOXACIN IN D5W 400 MG/200ML IV SOLN: 400.0000 mg | Freq: Once | INTRAVENOUS | Status: DC

## 2019-06-29 MED ORDER — HYDROMORPHONE HCL 1 MG/ML IJ SOLN
1.0000 mg | Freq: Once | INTRAMUSCULAR | Status: AC
  Administered 2019-06-29: 1 mg via INTRAVENOUS
  Filled 2019-06-29: qty 1

## 2019-06-29 MED ORDER — HYDROMORPHONE HCL 1 MG/ML IJ SOLN
1.0000 mg | Freq: Once | INTRAMUSCULAR | Status: AC
  Administered 2019-06-29: 03:00:00 1 mg via INTRAVENOUS
  Filled 2019-06-29: qty 1

## 2019-06-29 MED ORDER — METRONIDAZOLE 500 MG PO TABS: 500.0000 mg | ORAL_TABLET | Freq: Three times a day (TID) | ORAL | 0 refills | Status: AC

## 2019-06-29 MED ORDER — SULFAMETHOXAZOLE-TRIMETHOPRIM 800-160 MG PO TABS
2.0000 | ORAL_TABLET | Freq: Once | ORAL | Status: AC
  Administered 2019-06-29: 2 via ORAL
  Filled 2019-06-29: qty 2

## 2019-06-29 MED ORDER — OXYCODONE-ACETAMINOPHEN 5-325 MG PO TABS
1.0000 | ORAL_TABLET | Freq: Once | ORAL | Status: AC
  Administered 2019-06-29: 06:00:00 1 via ORAL
  Filled 2019-06-29: qty 1

## 2019-06-29 MED ORDER — SODIUM CHLORIDE 0.9 % IV BOLUS
1000.0000 mL | Freq: Once | INTRAVENOUS | Status: AC
  Administered 2019-06-29: 01:00:00 1000 mL via INTRAVENOUS

## 2019-06-29 MED ORDER — ONDANSETRON HCL 4 MG/2ML IJ SOLN
4.0000 mg | Freq: Once | INTRAMUSCULAR | Status: AC
  Administered 2019-06-29: 01:00:00 4 mg via INTRAVENOUS
  Filled 2019-06-29: qty 2

## 2019-06-29 MED ORDER — SULFAMETHOXAZOLE-TRIMETHOPRIM 800-160 MG PO TABS: 1.0000 | ORAL_TABLET | Freq: Two times a day (BID) | ORAL | 0 refills | Status: AC

## 2019-06-29 MED ORDER — LIDOCAINE-EPINEPHRINE 2 %-1:100000 IJ SOLN
20.0000 mL | Freq: Once | INTRAMUSCULAR | Status: AC
  Administered 2019-06-29: 05:00:00 20 mL
  Filled 2019-06-29: qty 1

## 2019-06-29 MED ORDER — SODIUM CHLORIDE (PF) 0.9 % IJ SOLN
INTRAMUSCULAR | Status: AC
  Filled 2019-06-29: qty 50

## 2019-06-29 NOTE — ED Provider Notes (Signed)
[facility] DEPT Provider Note   CSN: [id number] Arrival date & time: 06/28/19  2247     History No chief complaint on file.   Carlos Daugherty is a 49 y.o. male.  Patient presents to the emergency department for evaluation of rectal pain.  Patient reports that symptoms have been ongoing for 2 days.  He has a previous history of rectal abscess and fistula with surgical repair.  He reports that 2 days ago he woke up and there was blood and pus in his underwear.  He has been having persistent and worsening pain since.        Past Medical History:  Diagnosis Date  . Anal fistula   . Anxiety   . Arthritis   . Depression   . GERD (gastroesophageal reflux disease)   . Hypertension    followed by pcp    Patient Active Problem List   Diagnosis Date Noted  . 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 HTN (hypertension) 05/29/2018  . GERD (gastroesophageal reflux disease) 05/29/2018  . Tobacco abuse 05/29/2018  . DDD (degenerative disc disease), lumbar 09/27/2014    Past Surgical History:  Procedure Laterality Date  . COLONOSCOPY  07-28-2018   dr Silverio Decamp  . FISTULOTOMY N/A 11/07/2018   Procedure: PARTIAL FISTULOTOMY WITH CUTTING SETON PLACEMENT;  Surgeon: Ileana Roup, MD;  Location: [facility];  Service: General;  Laterality: N/A;  . INCISION AND DRAINAGE PERIRECTAL ABSCESS N/A 05/29/2018   Procedure: IRRIGATION AND DEBRIDEMENT PERIRECTAL ABSCESS;  Surgeon: Jovita Kussmaul, MD;  Location: [facility];  Service: General;  Laterality: N/A;  . INGUINAL HERNIA REPAIR Bilateral age 10 and age 57  . MAXIMUM ACCESS (MAS)POSTERIOR LUMBAR INTERBODY FUSION (PLIF) 1 LEVEL N/A 09/27/2014   Procedure:  L4-L5 mas PLIF  ;  Surgeon: Earnie Larsson, MD;  Location: [facility];  Service: Neurosurgery;  Laterality: N/A;   L4-L5 mas PLIF    . NASAL SINUS SURGERY  2015  . RECTAL EXAM UNDER ANESTHESIA N/A 01/01/2019   Procedure: ANORECTAL EXAM  UNDER ANESTHESIA, TIGHTENING OF CUTTING SETON;  Surgeon: Ileana Roup, MD;  Location: [facility];  Service: General;  Laterality: N/A;       Family History  Problem Relation Age of Onset  . Leukemia Father   . Colon cancer Neg Hx   . Esophageal cancer Neg Hx   . Rectal cancer Neg Hx   . Stomach cancer Neg Hx     Social History   Tobacco Use  . Smoking status: Current Every Day Smoker    Packs/day: 1.00    Years: 30.00    Pack years: 30.00    Types: Cigarettes  . Smokeless tobacco: Never Used  Substance Use Topics  . Alcohol use: Yes    Comment: weekly  . Drug use: Yes    Types: Marijuana    Comment: 12-26-2018 per pt lased used 8/ 2020    Home Medications Prior to Admission medications   Medication Sig Start Date End Date Taking? Authorizing Provider  esomeprazole (NEXIUM) 40 MG capsule Take 40 mg by mouth daily at 12 noon.   Yes [provider]  metroNIDAZOLE (FLAGYL) 500 MG tablet Take 1 tablet (500 mg total) by mouth 3 (three) times daily. 06/29/19   Orpah Greek, MD  oxyCODONE-acetaminophen (PERCOCET) 5-325 MG tablet Take 1-2 tablets by mouth every 4 (four) hours as needed. 06/29/19   Orpah Greek, MD  sulfamethoxazole-trimethoprim (BACTRIM DS) 800-160  MG tablet Take 1 tablet by mouth 2 (two) times daily. 06/29/19   Orpah Greek, MD    Allergies    Patient has no known allergies.  Review of Systems   Review of Systems  Gastrointestinal: Positive for rectal pain.  All other systems reviewed and are negative.   Physical Exam Updated Vital Signs BP 132/88   Pulse 81   Temp 98.3 F (36.8 C)   Resp 17   Ht 6\' 4"  (1.93 m)   Wt 97.5 kg   SpO2 95%   BMI 26.17 kg/m   Physical Exam Vitals and nursing note reviewed.  Constitutional:      General: He is not in acute distress.    Appearance: Normal appearance. He is well-developed.  HENT:     Head: Normocephalic and atraumatic.     Right Ear:  Hearing normal.     Left Ear: Hearing normal.     Nose: Nose normal.  Eyes:     Conjunctiva/sclera: Conjunctivae normal.     Pupils: Pupils are equal, round, and reactive to light.  Cardiovascular:     Rate and Rhythm: Regular rhythm. Tachycardia present.     Heart sounds: S1 normal and S2 normal. No murmur. No friction rub. No gallop.   Pulmonary:     Effort: Pulmonary effort is normal. No respiratory distress.     Breath sounds: Normal breath sounds.  Chest:     Chest wall: No tenderness.  Abdominal:     General: Bowel sounds are normal.     Palpations: Abdomen is soft.     Tenderness: There is no abdominal tenderness. There is no guarding or rebound. Negative signs include Murphy's sign and McBurney's sign.     Hernia: No hernia is present.  Genitourinary:    Comments: Surgical opening present left perianal region with small amount of surrounding fluctuance and small amount of purulent drainage Musculoskeletal:        General: Normal range of motion.     Cervical back: Normal range of motion and neck supple.  Skin:    General: Skin is warm and dry.     Findings: No rash.  Neurological:     Mental Status: He is alert and oriented to person, place, and time.     GCS: GCS eye subscore is 4. GCS verbal subscore is 5. GCS motor subscore is 6.     Cranial Nerves: No cranial nerve deficit.     Sensory: No sensory deficit.     Coordination: Coordination normal.  Psychiatric:        Speech: Speech normal.        Behavior: Behavior normal.        Thought Content: Thought content normal.     ED Results / Procedures / Treatments   Labs (all labs ordered are listed, but only abnormal results are displayed) Labs Reviewed  CBC WITH DIFFERENTIAL/PLATELET - Abnormal; Notable for the following components:      Result Value   WBC 13.9 (*)    Hemoglobin 11.8 (*)    HCT 37.3 (*)    RDW 19.4 (*)    Neutro Abs 9.9 (*)    Monocytes Absolute 1.6 (*)    All other components within normal  limits  BASIC METABOLIC PANEL  LACTIC ACID, PLASMA    EKG None  Radiology CT ABDOMEN PELVIS W CONTRAST  Result Date: 06/29/2019 CLINICAL DATA:  Perianal abscess EXAM: CT ABDOMEN AND PELVIS WITH CONTRAST TECHNIQUE: Multidetector CT imaging of  the abdomen and pelvis was performed using the standard protocol following bolus administration of intravenous contrast. CONTRAST:  113mL OMNIPAQUE IOHEXOL 300 MG/ML  SOLN COMPARISON:  07/10/2018 FINDINGS: Lower chest:  No contributory findings. Hepatobiliary: Tiny low-density in the left lobe liver is unchanged.No evidence of biliary obstruction or stone. Pancreas: Unremarkable. Spleen: Unremarkable. Adrenals/Urinary Tract: Negative adrenals. No hydronephrosis or stone. Unremarkable bladder. Stomach/Bowel: 26 x 24 x 14 mm residual or recurrent gas and fluid collection at the 9 o'clock aspect the anus, external to the levator musculature/external sphincter . No rectal wall thickening. No bowel obstruction. Vascular/Lymphatic: No acute vascular abnormality. Mild scattered atherosclerotic calcification. No mass or adenopathy. Reproductive:No pathologic findings. Other: No ascites or pneumoperitoneum. Musculoskeletal: L4-5 PLIF no demonstrable arthrodesis but also no rod/pedicle screw displacement. IMPRESSION: 26 x 24 x 14 mm perianal abscess at the 9 o'clock position. Electronically Signed   By: Monte Fantasia M.D.   On: 06/29/2019 04:07    Procedures .Marland KitchenIncision and Drainage  Date/Time: 06/29/2019 5:19 AM Performed by: Orpah Greek, MD Authorized by: Orpah Greek, MD   Consent:    Consent obtained:  Verbal   Consent given by:  Patient   Risks discussed:  Bleeding, incomplete drainage and pain Universal protocol:    Procedure explained and questions answered to patient or proxy's satisfaction: yes     Relevant documents present and verified: yes     Test results available and properly labeled: yes     Imaging studies available: yes       Required blood products, implants, devices, and special equipment available: yes     Site/side marked: yes     Immediately prior to procedure a time out was called: yes     Patient identity confirmed:  Verbally with patient Location:    Type:  Abscess   Size:  2cm   Location:  Anogenital   Anogenital location:  Perianal Pre-procedure details:    Skin preparation:  Betadine Anesthesia (see MAR for exact dosages):    Anesthesia method:  Local infiltration   Local anesthetic:  Lidocaine 2% WITH epi Procedure type:    Complexity:  Simple Procedure details:    Incision types:  Single straight   Scalpel blade:  11   Wound management:  Probed and deloculated and irrigated with saline   Drainage:  Bloody   Drainage amount:  Scant   Wound treatment:  Wound left open Post-procedure details:    Patient tolerance of procedure:  Tolerated well, no immediate complications   (including critical care time)  Medications Ordered in ED Medications  sodium chloride (PF) 0.9 % injection (has no administration in time range)  HYDROmorphone (DILAUDID) injection 1 mg (has no administration in time range)  metroNIDAZOLE (FLAGYL) tablet 500 mg (has no administration in time range)  sulfamethoxazole-trimethoprim (BACTRIM DS) 800-160 MG per tablet 2 tablet (has no administration in time range)  sodium chloride 0.9 % bolus 1,000 mL (0 mLs Intravenous Stopped 06/29/19 0250)  ondansetron (ZOFRAN) injection 4 mg (4 mg Intravenous Given 06/29/19 0044)  HYDROmorphone (DILAUDID) injection 1 mg (1 mg Intravenous Given 06/29/19 0044)  iohexol (OMNIPAQUE) 300 MG/ML solution 100 mL (100 mLs Intravenous Contrast Given 06/29/19 0202)  HYDROmorphone (DILAUDID) injection 1 mg (1 mg Intravenous Given 06/29/19 0250)  lidocaine-EPINEPHrine (XYLOCAINE W/EPI) 2 %-1:100000 (with pres) injection 20 mL (20 mLs Infiltration Given 06/29/19 0516)    ED Course  I have reviewed the triage vital signs and the nursing  notes.  Pertinent labs & imaging  results that were available during my care of the patient were reviewed by me and considered in my medical decision making (see chart for details).    MDM Rules/Calculators/A&P                      Patient presents to the emergency department for evaluation of rectal pain.  Patient has a history of what sounds like fairly extensive perirectal abscess resulting in fistula formation a year ago.  Examination revealed some fluctuance at 9:00 perianal area adjacent to what appears to be the previous surgical incision.  There was some drainage from this region but it was not clear if the fluctuant area was contiguous.  CT scan did show a small fluid collection.  Incision and drainage was therefore performed.  Will place on antibiotics.  Follow-up with his surgeon in the office this week.  Return if symptoms worsen.  Final Clinical Impression(s) / ED Diagnoses Final diagnoses:  Perianal abscess    Rx / DC Orders ED Discharge Orders         Ordered    sulfamethoxazole-trimethoprim (BACTRIM DS) 800-160 MG tablet  2 times daily     06/29/19 0523    metroNIDAZOLE (FLAGYL) 500 MG tablet  3 times daily     06/29/19 0523    oxyCODONE-acetaminophen (PERCOCET) 5-325 MG tablet  Every 4 hours PRN     06/29/19 0523           Orpah Greek, MD 06/29/19 [id number]

## 2019-06-29 NOTE — Discharge Instructions (Signed)
Call your general surgeon this morning to schedule follow-up for sometime this week.  If you develop fever or worsening symptoms, return to the ER.

## 2019-12-02 DIAGNOSIS — R159 Full incontinence of feces: Secondary | ICD-10-CM | POA: Diagnosis not present

## 2019-12-02 DIAGNOSIS — K2270 Barrett's esophagus without dysplasia: Secondary | ICD-10-CM | POA: Diagnosis not present

## 2019-12-02 DIAGNOSIS — K219 Gastro-esophageal reflux disease without esophagitis: Secondary | ICD-10-CM | POA: Diagnosis not present

## 2019-12-02 DIAGNOSIS — Z86010 Personal history of colonic polyps: Secondary | ICD-10-CM | POA: Diagnosis not present

## 2019-12-23 ENCOUNTER — Other Ambulatory Visit: Payer: Self-pay | Admitting: Gastroenterology

## 2019-12-23 DIAGNOSIS — I10 Essential (primary) hypertension: Secondary | ICD-10-CM | POA: Diagnosis not present

## 2019-12-24 ENCOUNTER — Other Ambulatory Visit: Payer: Self-pay | Admitting: Gastroenterology

## 2020-01-06 DIAGNOSIS — M5416 Radiculopathy, lumbar region: Secondary | ICD-10-CM | POA: Diagnosis not present

## 2020-01-07 ENCOUNTER — Other Ambulatory Visit: Payer: Self-pay | Admitting: Neurosurgery

## 2020-01-07 DIAGNOSIS — M5416 Radiculopathy, lumbar region: Secondary | ICD-10-CM

## 2020-01-30 ENCOUNTER — Other Ambulatory Visit: Payer: Self-pay

## 2020-01-30 ENCOUNTER — Ambulatory Visit
Admission: RE | Admit: 2020-01-30 | Discharge: 2020-01-30 | Disposition: A | Discharge status: Home / Self Care | Payer: BC Managed Care – PPO | Source: Ambulatory Visit | Attending: Neurosurgery | Admitting: Neurosurgery

## 2020-01-30 DIAGNOSIS — R109 Unspecified abdominal pain: Secondary | ICD-10-CM | POA: Diagnosis not present

## 2020-01-30 DIAGNOSIS — M5416 Radiculopathy, lumbar region: Secondary | ICD-10-CM

## 2020-01-30 DIAGNOSIS — Z981 Arthrodesis status: Secondary | ICD-10-CM | POA: Diagnosis not present

## 2020-01-30 DIAGNOSIS — M5116 Intervertebral disc disorders with radiculopathy, lumbar region: Secondary | ICD-10-CM | POA: Diagnosis not present

## 2020-01-30 DIAGNOSIS — M4316 Spondylolisthesis, lumbar region: Secondary | ICD-10-CM | POA: Diagnosis not present

## 2020-01-30 MED ORDER — GADOBENATE DIMEGLUMINE 529 MG/ML IV SOLN
20.0000 mL | Freq: Once | INTRAVENOUS | Status: AC | PRN
  Administered 2020-01-30: 20 mL via INTRAVENOUS

## 2020-02-03 DIAGNOSIS — M533 Sacrococcygeal disorders, not elsewhere classified: Secondary | ICD-10-CM | POA: Diagnosis not present

## 2020-02-03 DIAGNOSIS — M5416 Radiculopathy, lumbar region: Secondary | ICD-10-CM | POA: Diagnosis not present

## 2020-02-09 ENCOUNTER — Other Ambulatory Visit (HOSPITAL_COMMUNITY): Payer: BC Managed Care – PPO

## 2020-02-10 ENCOUNTER — Other Ambulatory Visit (HOSPITAL_COMMUNITY)
Admission: RE | Admit: 2020-02-10 | Discharge: 2020-02-10 | Disposition: A | Discharge status: Home / Self Care | Payer: BC Managed Care – PPO | Source: Ambulatory Visit | Attending: Gastroenterology | Admitting: Gastroenterology

## 2020-02-10 DIAGNOSIS — Z01818 Encounter for other preprocedural examination: Secondary | ICD-10-CM | POA: Insufficient documentation

## 2020-02-10 DIAGNOSIS — Z20822 Contact with and (suspected) exposure to covid-19: Secondary | ICD-10-CM | POA: Insufficient documentation

## 2020-02-10 DIAGNOSIS — R159 Full incontinence of feces: Secondary | ICD-10-CM | POA: Diagnosis not present

## 2020-02-10 LAB — SARS CORONAVIRUS 2 (TAT 6-24 HRS): SARS Coronavirus 2: NEGATIVE

## 2020-02-12 ENCOUNTER — Encounter (HOSPITAL_COMMUNITY)
Admission: RE | Disposition: A | Discharge status: Home / Self Care | Payer: Self-pay | Source: Home / Self Care | Attending: Gastroenterology

## 2020-02-12 ENCOUNTER — Ambulatory Visit (HOSPITAL_COMMUNITY)
Admission: RE | Admit: 2020-02-12 | Discharge: 2020-02-12 | Disposition: A | Discharge status: Home / Self Care | Payer: BC Managed Care – PPO | Attending: Gastroenterology | Admitting: Gastroenterology

## 2020-02-12 DIAGNOSIS — Z20822 Contact with and (suspected) exposure to covid-19: Secondary | ICD-10-CM | POA: Insufficient documentation

## 2020-02-12 DIAGNOSIS — R159 Full incontinence of feces: Secondary | ICD-10-CM | POA: Diagnosis not present

## 2020-02-12 SURGERY — INVASIVE LAB ABORTED CASE

## 2020-02-12 NOTE — Progress Notes (Signed)
Patient in for anal rectal manometry today. Tried initialy with 3D AR probe , but unable to advance probe fully and patient reported discomfort. Small amount of blood noted on the probe when removed. Changed to 2D AR probe, but still unable to advance all the way without patient reporting discomfort.  Procedure was aborted. Patient instructed to watch for sign of increased bleeding. Dr. Paulita Fujita notified.

## 2020-02-18 DIAGNOSIS — F419 Anxiety disorder, unspecified: Secondary | ICD-10-CM | POA: Diagnosis not present

## 2020-02-23 DIAGNOSIS — Z6827 Body mass index (BMI) 27.0-27.9, adult: Secondary | ICD-10-CM | POA: Diagnosis not present

## 2020-02-23 DIAGNOSIS — M461 Sacroiliitis, not elsewhere classified: Secondary | ICD-10-CM | POA: Diagnosis not present

## 2020-02-23 DIAGNOSIS — I10 Essential (primary) hypertension: Secondary | ICD-10-CM | POA: Diagnosis not present

## 2020-02-23 DIAGNOSIS — M533 Sacrococcygeal disorders, not elsewhere classified: Secondary | ICD-10-CM | POA: Diagnosis not present

## 2020-03-31 DIAGNOSIS — R0602 Shortness of breath: Secondary | ICD-10-CM | POA: Diagnosis not present

## 2020-03-31 DIAGNOSIS — R0600 Dyspnea, unspecified: Secondary | ICD-10-CM | POA: Diagnosis not present

## 2020-03-31 DIAGNOSIS — U071 COVID-19: Secondary | ICD-10-CM | POA: Diagnosis not present

## 2020-04-27 DIAGNOSIS — F419 Anxiety disorder, unspecified: Secondary | ICD-10-CM | POA: Diagnosis not present

## 2020-05-25 DIAGNOSIS — Z720 Tobacco use: Secondary | ICD-10-CM | POA: Diagnosis not present

## 2020-05-25 DIAGNOSIS — F419 Anxiety disorder, unspecified: Secondary | ICD-10-CM | POA: Diagnosis not present

## 2020-05-25 DIAGNOSIS — I10 Essential (primary) hypertension: Secondary | ICD-10-CM | POA: Diagnosis not present

## 2020-08-02 DIAGNOSIS — R739 Hyperglycemia, unspecified: Secondary | ICD-10-CM | POA: Diagnosis not present

## 2020-08-02 DIAGNOSIS — Z125 Encounter for screening for malignant neoplasm of prostate: Secondary | ICD-10-CM | POA: Diagnosis not present

## 2020-08-02 DIAGNOSIS — R5382 Chronic fatigue, unspecified: Secondary | ICD-10-CM | POA: Diagnosis not present

## 2020-08-02 DIAGNOSIS — D509 Iron deficiency anemia, unspecified: Secondary | ICD-10-CM | POA: Diagnosis not present

## 2020-08-02 DIAGNOSIS — Z0000 Encounter for general adult medical examination without abnormal findings: Secondary | ICD-10-CM | POA: Diagnosis not present

## 2020-08-02 DIAGNOSIS — I10 Essential (primary) hypertension: Secondary | ICD-10-CM | POA: Diagnosis not present

## 2021-02-14 DIAGNOSIS — S2231XA Fracture of one rib, right side, initial encounter for closed fracture: Secondary | ICD-10-CM | POA: Diagnosis not present

## 2021-04-14 IMAGING — MR MR LUMBAR SPINE WO/W CM
5 of 8 series · 25 of 48 positions shown · IV contrast (20ml Multihance)
Comparison: 10/15/2015

CLINICAL DATA: Lumbar radiculopathy. Low back pain and primarily
left-sided flank, hip, and leg pain. Prior lumbar fusion.

EXAM:
MRI LUMBAR SPINE WITHOUT AND WITH CONTRAST
TECHNIQUE: Multiplanar and multiecho pulse sequences of the lumbar spine were
obtained without and with intravenous contrast.
CONTRAST:  20mL MULTIHANCE GADOBENATE DIMEGLUMINE 529 MG/ML IV SOLN

[Series 4: T1 · sagittal · 4.0mm · 0.55mm/px · 3 of 16 slices shown (1 of 2)]
[im 1/16]
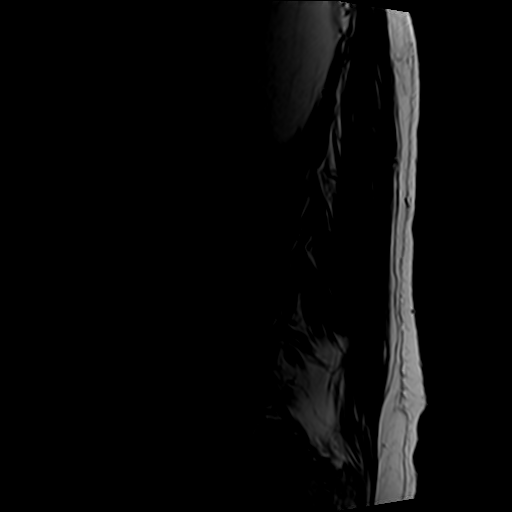
[im 8/16]
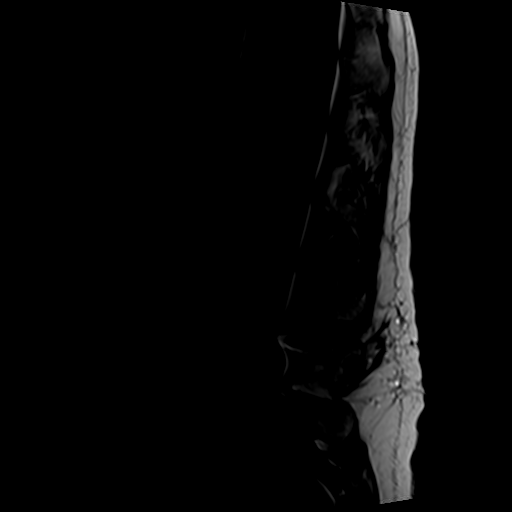
[im 16/16]
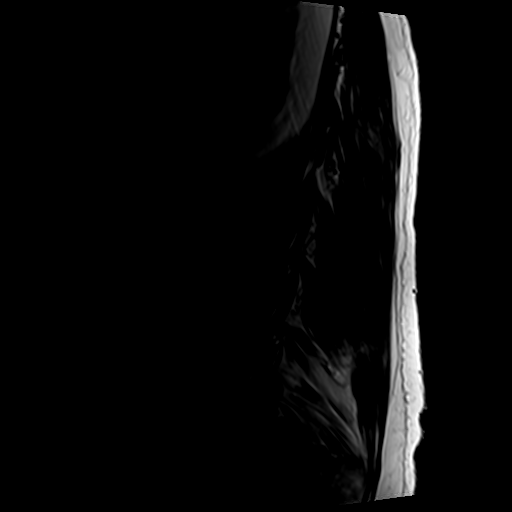

[Series 5: T2 · axial · 4.0mm · 0.70mm/px · z∈[-180,+57]mm · 8 of 45 slices shown]
[im 1/45]
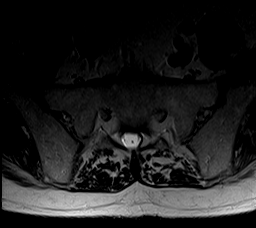
[im 5/45]
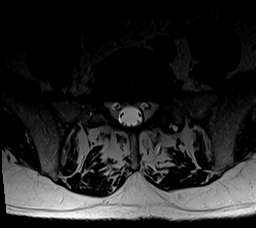
[im 15/45]
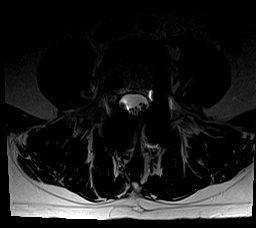
[im 20/45]
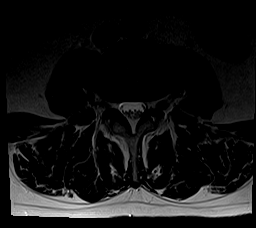
[im 25/45]
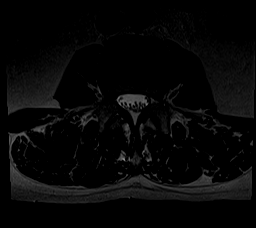
[im 30/45]
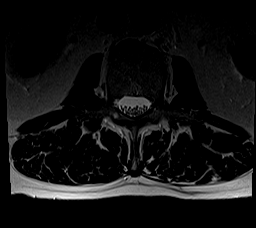
[im 40/45]
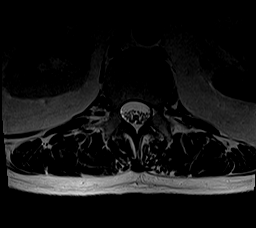
[im 45/45]
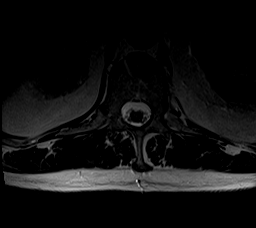

[Series 6: T1 · axial · 4.0mm · 0.35mm/px · z∈[-180,+57]mm · 8 of 45 slices shown (2 of 2)]
[im 1/45]
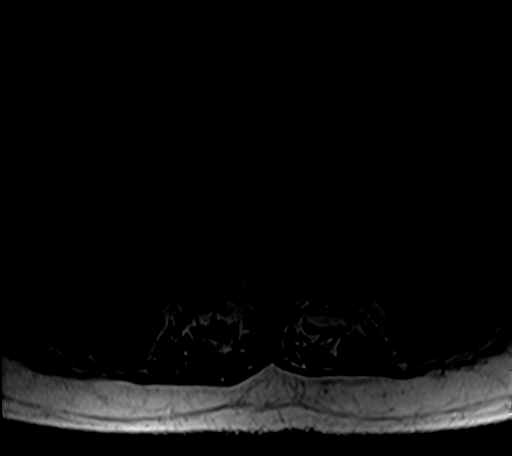
[im 5/45]
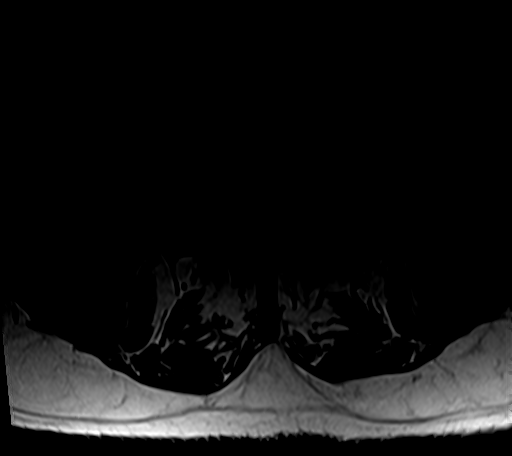
[im 15/45]
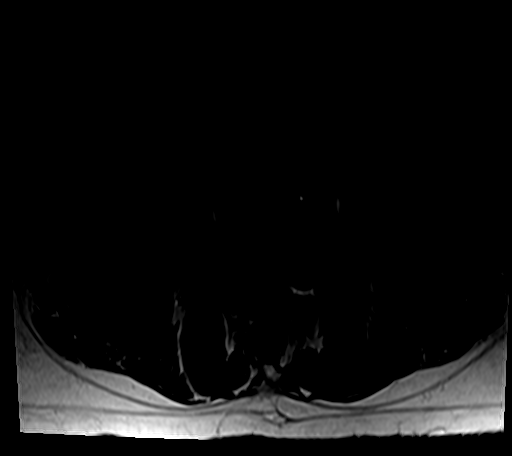
[im 20/45]
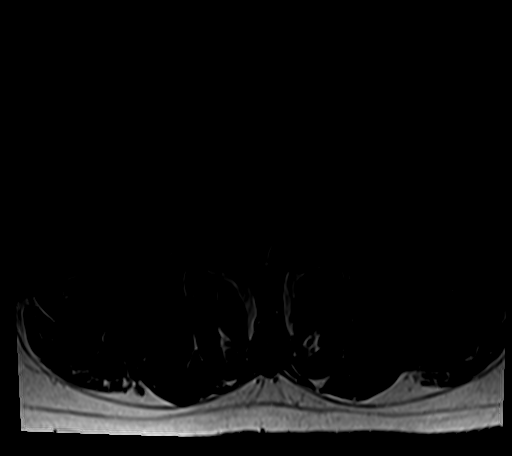
[im 25/45]
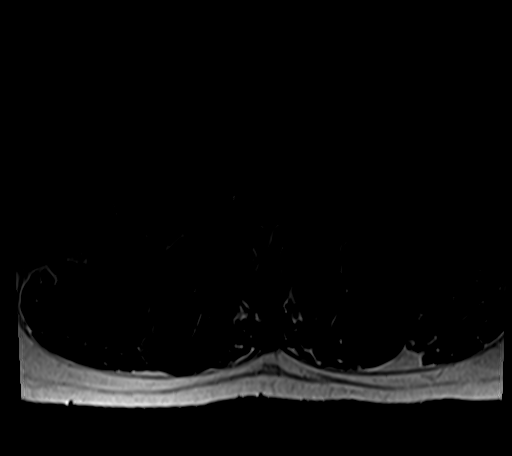
[im 30/45]
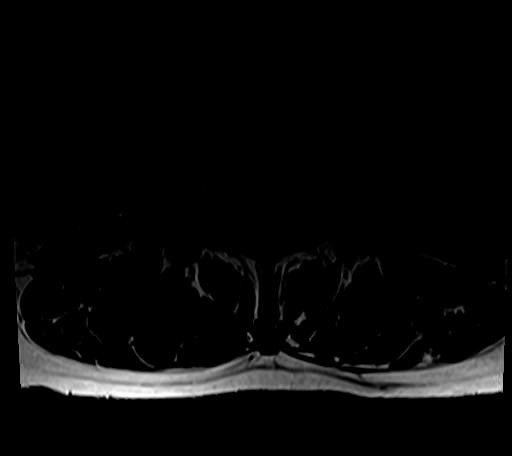
[im 40/45]
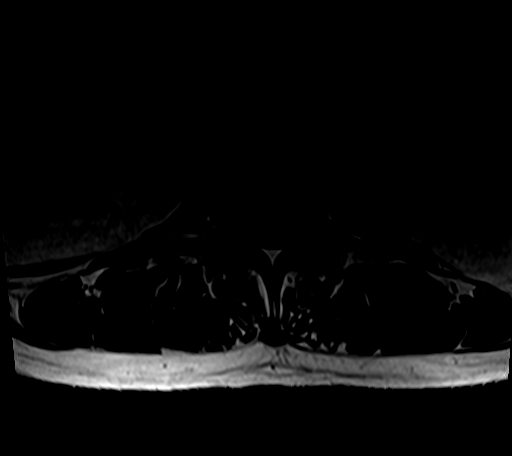
[im 45/45]
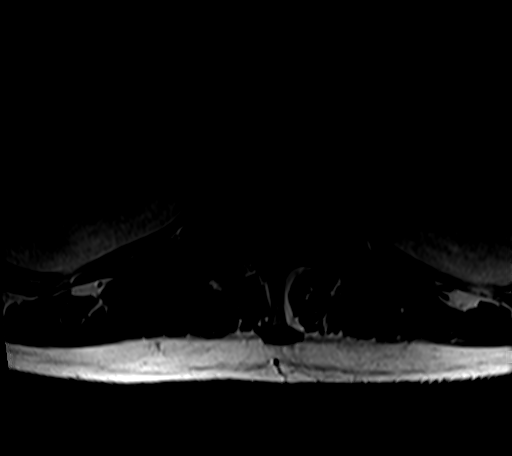

[Series 7: T2 post-contrast · sagittal · 4.0mm · 0.55mm/px · 4 of 16 slices shown]
[im 1/16]
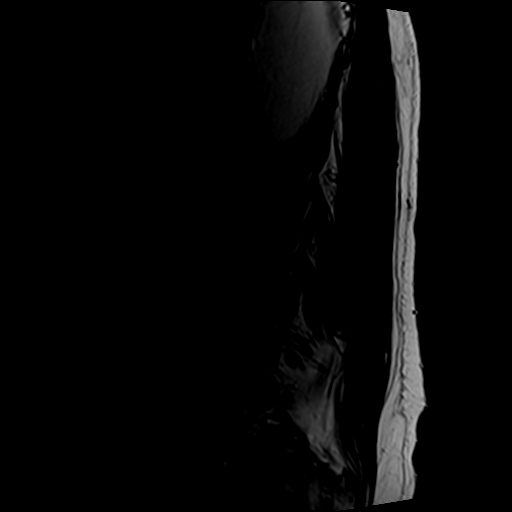
[im 6/16]
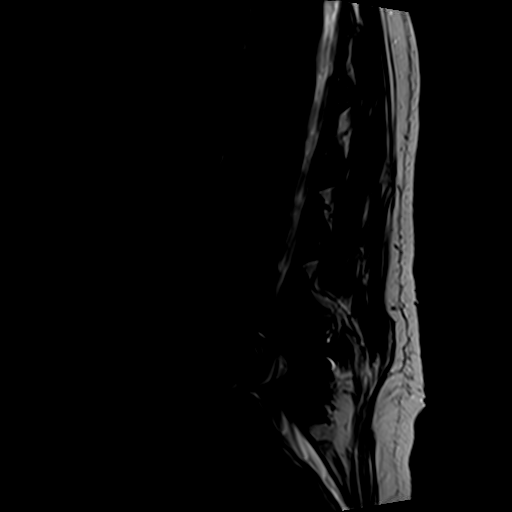
[im 11/16]
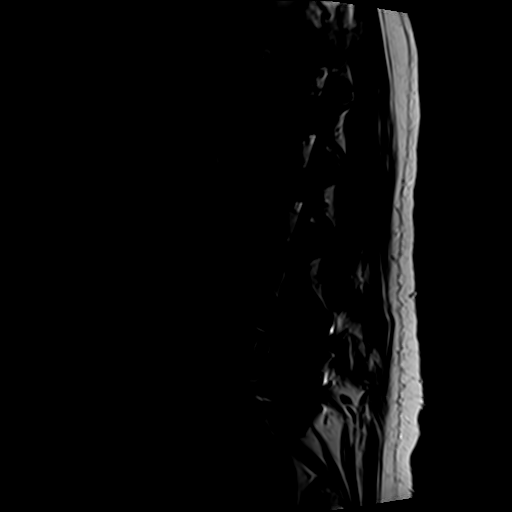
[im 16/16]
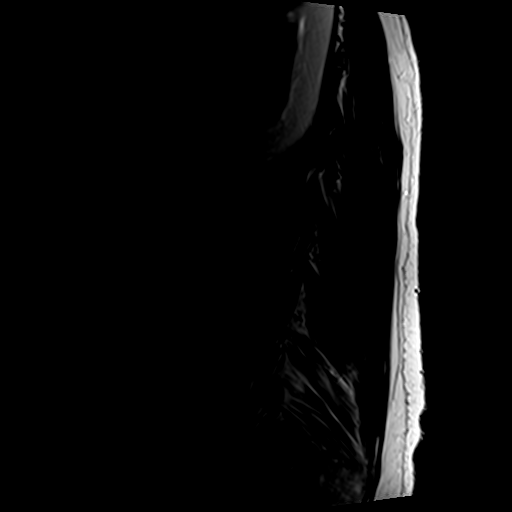

[Series 8: T1 fat-sat · sagittal · 4.0mm · 0.55mm/px · 2 of 16 slices shown]
[im 1/16]
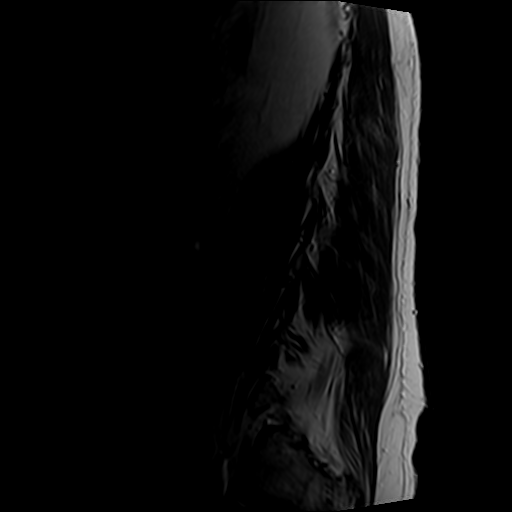
[im 6/16]
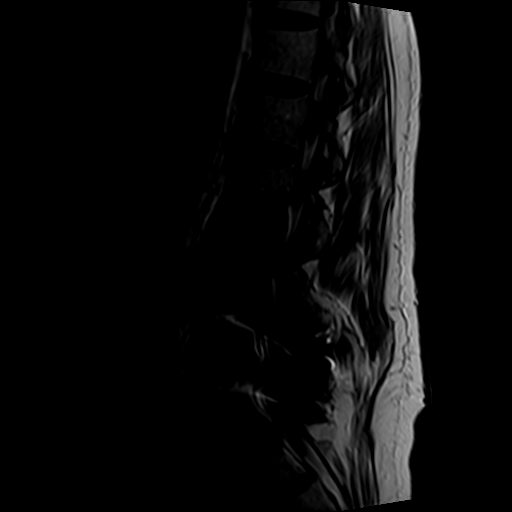

[25 of 48 positions shown; findings below may reference images not displayed]

FINDINGS: Segmentation:  Standard.

Alignment:  Unchanged grade 1 retrolisthesis of L3 on L4.

Vertebrae: No fracture, suspicious osseous lesion, or significant
marrow edema. Prior L4-5 posterior and interbody fusion.

Conus medullaris and cauda equina: Conus extends to the L1 level.
Conus and cauda equina appear normal.

Paraspinal and other soft tissues: Unremarkable.

Disc levels:

T12-L1 and L1-2: Negative.

L2-3: New shallow left paracentral disc protrusion without stenosis.

L3-4: Retrolisthesis with slight bulging of uncovered disc. Interval
regression of a superimposed shallow central disc protrusion. No
significant stenosis.

L4-5: Prior posterior decompression and fusion.  No stenosis.

L5-S1: Normal disc. Moderate left facet hypertrophy without
stenosis, unchanged.
IMPRESSION: 1. New shallow left paracentral disc protrusion at L2-3 without
stenosis.
2. Interval regression of a shallow central disc protrusion at L3-4.
3. Prior L4-5 fusion without stenosis.

## 2021-09-15 DIAGNOSIS — D509 Iron deficiency anemia, unspecified: Secondary | ICD-10-CM | POA: Diagnosis not present

## 2021-09-15 DIAGNOSIS — E7800 Pure hypercholesterolemia, unspecified: Secondary | ICD-10-CM | POA: Diagnosis not present

## 2021-09-15 DIAGNOSIS — I10 Essential (primary) hypertension: Secondary | ICD-10-CM | POA: Diagnosis not present

## 2021-09-15 DIAGNOSIS — Z0000 Encounter for general adult medical examination without abnormal findings: Secondary | ICD-10-CM | POA: Diagnosis not present

## 2021-09-15 DIAGNOSIS — F419 Anxiety disorder, unspecified: Secondary | ICD-10-CM | POA: Diagnosis not present

## 2021-11-15 DIAGNOSIS — D509 Iron deficiency anemia, unspecified: Secondary | ICD-10-CM | POA: Diagnosis not present

## 2021-11-15 DIAGNOSIS — F419 Anxiety disorder, unspecified: Secondary | ICD-10-CM | POA: Diagnosis not present

## 2021-11-15 DIAGNOSIS — I10 Essential (primary) hypertension: Secondary | ICD-10-CM | POA: Diagnosis not present

## 2021-11-15 DIAGNOSIS — Z125 Encounter for screening for malignant neoplasm of prostate: Secondary | ICD-10-CM | POA: Diagnosis not present

## 2021-11-15 DIAGNOSIS — E7800 Pure hypercholesterolemia, unspecified: Secondary | ICD-10-CM | POA: Diagnosis not present

## 2021-11-15 DIAGNOSIS — R7303 Prediabetes: Secondary | ICD-10-CM | POA: Diagnosis not present

## 2022-01-12 DIAGNOSIS — R059 Cough, unspecified: Secondary | ICD-10-CM | POA: Diagnosis not present

## 2022-09-12 DIAGNOSIS — E7800 Pure hypercholesterolemia, unspecified: Secondary | ICD-10-CM | POA: Diagnosis not present

## 2022-09-12 DIAGNOSIS — R600 Localized edema: Secondary | ICD-10-CM | POA: Diagnosis not present

## 2022-09-12 DIAGNOSIS — I10 Essential (primary) hypertension: Secondary | ICD-10-CM | POA: Diagnosis not present

## 2022-09-12 DIAGNOSIS — D509 Iron deficiency anemia, unspecified: Secondary | ICD-10-CM | POA: Diagnosis not present

## 2022-09-12 DIAGNOSIS — Z125 Encounter for screening for malignant neoplasm of prostate: Secondary | ICD-10-CM | POA: Diagnosis not present

## 2022-09-12 DIAGNOSIS — R7303 Prediabetes: Secondary | ICD-10-CM | POA: Diagnosis not present

## 2022-10-02 DIAGNOSIS — Z0000 Encounter for general adult medical examination without abnormal findings: Secondary | ICD-10-CM | POA: Diagnosis not present

## 2022-10-02 DIAGNOSIS — F419 Anxiety disorder, unspecified: Secondary | ICD-10-CM | POA: Diagnosis not present

## 2022-10-02 DIAGNOSIS — I10 Essential (primary) hypertension: Secondary | ICD-10-CM | POA: Diagnosis not present

## 2022-10-02 DIAGNOSIS — R600 Localized edema: Secondary | ICD-10-CM | POA: Diagnosis not present

## 2022-10-02 DIAGNOSIS — E7800 Pure hypercholesterolemia, unspecified: Secondary | ICD-10-CM | POA: Diagnosis not present

## 2022-10-11 DIAGNOSIS — R600 Localized edema: Secondary | ICD-10-CM | POA: Diagnosis not present

## 2022-10-11 DIAGNOSIS — F419 Anxiety disorder, unspecified: Secondary | ICD-10-CM | POA: Diagnosis not present

## 2022-10-11 DIAGNOSIS — H6123 Impacted cerumen, bilateral: Secondary | ICD-10-CM | POA: Diagnosis not present

## 2022-11-12 DIAGNOSIS — S61411A Laceration without foreign body of right hand, initial encounter: Secondary | ICD-10-CM | POA: Diagnosis not present

## 2023-10-03 DIAGNOSIS — E7800 Pure hypercholesterolemia, unspecified: Secondary | ICD-10-CM | POA: Diagnosis not present

## 2023-10-03 DIAGNOSIS — Z87891 Personal history of nicotine dependence: Secondary | ICD-10-CM | POA: Diagnosis not present

## 2023-10-03 DIAGNOSIS — I10 Essential (primary) hypertension: Secondary | ICD-10-CM | POA: Diagnosis not present

## 2023-10-03 DIAGNOSIS — F419 Anxiety disorder, unspecified: Secondary | ICD-10-CM | POA: Diagnosis not present

## 2023-10-03 DIAGNOSIS — Z0000 Encounter for general adult medical examination without abnormal findings: Secondary | ICD-10-CM | POA: Diagnosis not present

## 2023-10-09 ENCOUNTER — Other Ambulatory Visit (HOSPITAL_BASED_OUTPATIENT_CLINIC_OR_DEPARTMENT_OTHER): Payer: Self-pay | Admitting: Family Medicine

## 2023-10-09 DIAGNOSIS — Z125 Encounter for screening for malignant neoplasm of prostate: Secondary | ICD-10-CM | POA: Diagnosis not present

## 2023-10-09 DIAGNOSIS — E7800 Pure hypercholesterolemia, unspecified: Secondary | ICD-10-CM | POA: Diagnosis not present

## 2023-10-09 DIAGNOSIS — I10 Essential (primary) hypertension: Secondary | ICD-10-CM | POA: Diagnosis not present

## 2023-10-09 DIAGNOSIS — R739 Hyperglycemia, unspecified: Secondary | ICD-10-CM | POA: Diagnosis not present

## 2023-10-09 DIAGNOSIS — Z0000 Encounter for general adult medical examination without abnormal findings: Secondary | ICD-10-CM | POA: Diagnosis not present

## 2023-10-09 DIAGNOSIS — R0602 Shortness of breath: Secondary | ICD-10-CM | POA: Diagnosis not present

## 2023-10-09 DIAGNOSIS — Z23 Encounter for immunization: Secondary | ICD-10-CM | POA: Diagnosis not present

## 2023-10-29 ENCOUNTER — Ambulatory Visit (HOSPITAL_BASED_OUTPATIENT_CLINIC_OR_DEPARTMENT_OTHER)
Admission: RE | Admit: 2023-10-29 | Discharge: 2023-10-29 | Disposition: A | Discharge status: Home / Self Care | Payer: Self-pay | Source: Ambulatory Visit | Attending: Family Medicine | Admitting: Family Medicine

## 2023-10-29 DIAGNOSIS — E7800 Pure hypercholesterolemia, unspecified: Secondary | ICD-10-CM | POA: Insufficient documentation

## 2023-11-14 DIAGNOSIS — K3189 Other diseases of stomach and duodenum: Secondary | ICD-10-CM | POA: Diagnosis not present

## 2023-11-14 DIAGNOSIS — D123 Benign neoplasm of transverse colon: Secondary | ICD-10-CM | POA: Diagnosis not present

## 2023-11-14 DIAGNOSIS — K2270 Barrett's esophagus without dysplasia: Secondary | ICD-10-CM | POA: Diagnosis not present

## 2023-11-14 DIAGNOSIS — Z860101 Personal history of adenomatous and serrated colon polyps: Secondary | ICD-10-CM | POA: Diagnosis not present

## 2023-11-14 DIAGNOSIS — K648 Other hemorrhoids: Secondary | ICD-10-CM | POA: Diagnosis not present

## 2023-11-14 DIAGNOSIS — K2930 Chronic superficial gastritis without bleeding: Secondary | ICD-10-CM | POA: Diagnosis not present

## 2023-11-14 DIAGNOSIS — Z09 Encounter for follow-up examination after completed treatment for conditions other than malignant neoplasm: Secondary | ICD-10-CM | POA: Diagnosis not present

## 2023-11-14 DIAGNOSIS — K5730 Diverticulosis of large intestine without perforation or abscess without bleeding: Secondary | ICD-10-CM | POA: Diagnosis not present

## 2023-11-14 DIAGNOSIS — K6289 Other specified diseases of anus and rectum: Secondary | ICD-10-CM | POA: Diagnosis not present

## 2023-11-14 DIAGNOSIS — K635 Polyp of colon: Secondary | ICD-10-CM | POA: Diagnosis not present

## 2023-11-14 DIAGNOSIS — D128 Benign neoplasm of rectum: Secondary | ICD-10-CM | POA: Diagnosis not present

## 2023-12-24 DIAGNOSIS — E7800 Pure hypercholesterolemia, unspecified: Secondary | ICD-10-CM | POA: Diagnosis not present

## 2023-12-30 ENCOUNTER — Institutional Professional Consult (permissible substitution) (HOSPITAL_BASED_OUTPATIENT_CLINIC_OR_DEPARTMENT_OTHER): Admitting: Internal Medicine

## 2024-01-20 ENCOUNTER — Ambulatory Visit (HOSPITAL_BASED_OUTPATIENT_CLINIC_OR_DEPARTMENT_OTHER): Admitting: Internal Medicine

## 2024-01-20 ENCOUNTER — Encounter (HOSPITAL_BASED_OUTPATIENT_CLINIC_OR_DEPARTMENT_OTHER): Payer: Self-pay | Admitting: Internal Medicine

## 2024-01-20 VITALS — BP 156/100 | HR 131 | Ht 76.0 in | Wt 244.0 lb

## 2024-01-20 DIAGNOSIS — E785 Hyperlipidemia, unspecified: Secondary | ICD-10-CM

## 2024-01-20 DIAGNOSIS — F1090 Alcohol use, unspecified, uncomplicated: Secondary | ICD-10-CM | POA: Diagnosis not present

## 2024-01-20 DIAGNOSIS — I2510 Atherosclerotic heart disease of native coronary artery without angina pectoris: Secondary | ICD-10-CM | POA: Diagnosis not present

## 2024-01-20 DIAGNOSIS — I10 Essential (primary) hypertension: Secondary | ICD-10-CM

## 2024-01-20 NOTE — Patient Instructions (Signed)
Medication Instructions:  No changes *If you need a refill on your cardiac medications before your next appointment, please call your pharmacy*   Lab Work: none   Testing/Procedures: none   Follow-Up: As needed   

## 2024-01-20 NOTE — Progress Notes (Signed)
 LIPID CLINIC CONSULT NOTE  Chief Complaint:  Manage dyslipidemia  Primary Care Physician: Carlos Anthony RAMAN, FNP  Primary Cardiologist:  None  HPI:  Carlos Daugherty is a 53 y.o. male who is being seen today for the evaluation of dyslipidemia at the request of Carlos Anthony RAMAN, FNP.  This is a pleasant 53 year old male kindly referred for evaluation management of dyslipidemia.  He recently underwent calcium  scoring which was elevated at 91.3, 80th percentile for age and sex matched controls.  He has a history of high cholesterol with LDL of 144.  He was started on rosuvastatin 20 mg daily which he is tolerating well.  He had recent repeat lipids and to show marked improvement in his cholesterol now with total 200, triglycerides 176, HDL 97 and LDL 75.  He does report that he recently has been using alcohol more regularly, which may be contributing to high triglycerides.  His blood pressure was high today 156/100 despite taking his medication and he feels that this may be related to binging alcohol last evening.  PMHx:  Past Medical History:  Diagnosis Date   Anal fistula    Anxiety    Arthritis    Depression    GERD (gastroesophageal reflux disease)    Hypertension    followed by pcp    Past Surgical History:  Procedure Laterality Date   COLONOSCOPY  07-28-2018   dr shila   FISTULOTOMY N/A 11/07/2018   Procedure: PARTIAL FISTULOTOMY WITH CUTTING SETON PLACEMENT;  Surgeon: Carlos Lonni HERO, MD;  Location: [facility];  Service: General;  Laterality: N/A;   INCISION AND DRAINAGE PERIRECTAL ABSCESS N/A 05/29/2018   Procedure: IRRIGATION AND DEBRIDEMENT PERIRECTAL ABSCESS;  Surgeon: Carlos Deward MOULD, MD;  Location: [facility];  Service: General;  Laterality: N/A;   INGUINAL HERNIA REPAIR Bilateral age 57 and age 19   MAXIMUM ACCESS (MAS)POSTERIOR LUMBAR INTERBODY FUSION (PLIF) 1 LEVEL N/A 09/27/2014   Procedure:  L4-L5 mas PLIF  ;  Surgeon: Carlos Gunnels, MD;  Location: [facility];  Service: Neurosurgery;  Laterality: N/A;   L4-L5 mas PLIF     NASAL SINUS SURGERY  2015   RECTAL EXAM UNDER ANESTHESIA N/A 01/01/2019   Procedure: ANORECTAL EXAM UNDER ANESTHESIA, TIGHTENING OF CUTTING SETON;  Surgeon: Carlos Lonni HERO, MD;  Location: [facility];  Service: General;  Laterality: N/A;    FAMHx:  Family History  Problem Relation Age of Onset   Leukemia Father    Colon cancer Neg Hx    Esophageal cancer Neg Hx    Rectal cancer Neg Hx    Stomach cancer Neg Hx     SOCHx:   reports that he has been smoking cigars and cigarettes. He has a 30 pack-year smoking history. He has never used smokeless tobacco. He reports current alcohol use. He reports current drug use. Drug: Marijuana.  ALLERGIES:  No Known Allergies  ROS: Pertinent items noted in HPI and remainder of comprehensive ROS otherwise negative.  HOME MEDS: Current Outpatient Medications on File Prior to Visit  Medication Sig Dispense Refill   ALPRAZolam (XANAX) 0.25 MG tablet Take by mouth.     amLODipine (NORVASC) 10 MG tablet Take 10 mg by mouth daily.     busPIRone (BUSPAR) 15 MG tablet Take 15 mg by mouth 2 (two) times daily.     esomeprazole (NEXIUM) 40 MG capsule Take 40 mg by mouth daily at 12 noon.     furosemide (LASIX) 20  MG tablet as needed for fluid.     rosuvastatin (CRESTOR) 20 MG tablet Take 20 mg by mouth daily.     metroNIDAZOLE  (FLAGYL ) 500 MG tablet Take 1 tablet (500 mg total) by mouth 3 (three) times daily. (Patient not taking: Reported on 01/20/2024) 30 tablet 0   oxyCODONE -acetaminophen  (PERCOCET) 5-325 MG tablet Take 1-2 tablets by mouth every 4 (four) hours as needed. (Patient not taking: Reported on 01/20/2024) 20 tablet 0   sulfamethoxazole -trimethoprim  (BACTRIM  DS) 800-160 MG tablet Take 1 tablet by mouth 2 (two) times daily. (Patient not taking: Reported on 01/20/2024) 20 tablet 0   No current facility-administered medications on file prior to visit.     LABS/IMAGING: No results found for this or any previous visit (from the past 48 hours). No results found.  LIPID PANEL: No results found for: CHOL, TRIG, HDL, CHOLHDL, VLDL, LDLCALC, LDLDIRECT  No results found for: LIPOA   WEIGHTS: Wt Readings from Last 3 Encounters:  01/20/24 244 lb (110.7 kg)  06/28/19 215 lb (97.5 kg)  01/01/19 207 lb 8 oz (94.1 kg)    VITALS: BP (!) 156/100   Pulse (!) 131   Ht 6' 4 (1.93 m)   Wt 244 lb (110.7 kg)   SpO2 97%   BMI 29.70 kg/m   EXAM: Deferred  EKG: Deferred  ASSESSMENT: Dyslipidemia, goal LDL less than 70 CAC score of 91.3, 80th percentile (2025) Hypertension Binge alcohol use Anxiety  PLAN: 1.   Carlos Daugherty has a dyslipidemia Target LDL less than 70 was found to have coronary artery calcification which is age advanced.  He needs aggressive lipid-lowering.  He is LDL now is down to 75 on rosuvastatin 20 mg daily.  This is pretty good and I suspect if he was able to cut back on his drinking or stop he would reach target.  I recommend continuing his current therapy.  I would advise repeat labs in 6 months including an LP(a) through his primary care provider.  In addition his blood pressure was high today however he said it may be due to some binge alcohol use last night.  I have encouraged him to continue to try to cut back on that or eliminate it.  Thanks for the kind referral.  Follow-up with me as needed.  Carlos KYM Maxcy, MD, [facility], FNLA, FACP  [place]  [facility]  Medical Director of the [facility] Lipid [facility] &  Cardiovascular Risk Reduction Clinic Diplomate of the American Board of Clinical Lipidology Attending Cardiologist  Direct Dial: [phone]  Fax: [phone]  Website:  [email or web address] Carlos Daugherty Carlos Daugherty 01/20/2024, 3:08 PM

## 2024-01-29 DIAGNOSIS — E876 Hypokalemia: Secondary | ICD-10-CM | POA: Diagnosis not present
